# Patient Record
Sex: Female | Born: 1990 | Race: White | Hispanic: No | Marital: Married | Smoking: Former smoker
Health system: Southern US, Community
[De-identification: ages and names within clinical notes are randomized; demographics above are authoritative.]

## PROBLEM LIST (undated history)

## (undated) DIAGNOSIS — Z789 Other specified health status: Secondary | ICD-10-CM

## (undated) DIAGNOSIS — Z8742 Personal history of other diseases of the female genital tract: Secondary | ICD-10-CM

## (undated) HISTORY — PX: WISDOM TOOTH EXTRACTION: SHX21

## (undated) HISTORY — DX: Personal history of other diseases of the female genital tract: Z87.42

---

## 2016-09-10 DIAGNOSIS — Z8742 Personal history of other diseases of the female genital tract: Secondary | ICD-10-CM

## 2016-09-10 HISTORY — DX: Personal history of other diseases of the female genital tract: Z87.42

## 2016-11-10 HISTORY — PX: COLPOSCOPY: SHX161

## 2017-03-22 ENCOUNTER — Ambulatory Visit (INDEPENDENT_AMBULATORY_CARE_PROVIDER_SITE_OTHER): Payer: 59 | Admitting: Certified Nurse Midwife

## 2017-03-22 ENCOUNTER — Encounter: Payer: Self-pay | Admitting: Certified Nurse Midwife

## 2017-03-22 VITALS — BP 100/60 | Ht 66.0 in | Wt 159.0 lb

## 2017-03-22 DIAGNOSIS — Z3491 Encounter for supervision of normal pregnancy, unspecified, first trimester: Secondary | ICD-10-CM

## 2017-03-22 DIAGNOSIS — Z87898 Personal history of other specified conditions: Secondary | ICD-10-CM

## 2017-03-22 DIAGNOSIS — Z3A08 8 weeks gestation of pregnancy: Secondary | ICD-10-CM

## 2017-03-22 DIAGNOSIS — Z8742 Personal history of other diseases of the female genital tract: Secondary | ICD-10-CM

## 2017-03-22 DIAGNOSIS — Z1379 Encounter for other screening for genetic and chromosomal anomalies: Secondary | ICD-10-CM

## 2017-03-22 NOTE — Progress Notes (Signed)
New Obstetric Patient H&P    Chief Complaint: "Desires prenatal care." Pt c/o nausea, fatigue and lower abdominal cramping.   History of Present Illness: Patient is a 26 y.o. G1P0 Not Hispanic or Latino female, LMP 30 April 18 presents with amenorrhea and positive home pregnancy test. Based on her  LMP, her EDD is Estimated Date of Delivery: 10/29/17 and her EGA is 5269w3d. Since stopping her pills in August 2017, cycles are 2-4 days long, regular, and occur approximately every : 28 days. 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 Colposcopy and biopsy returned: + HPV changes   She had a urine pregnancy test which was positive 1 month(s)  Ago (30 April). Her last menstrual period was light and lasted for  1 day(s). Since her LMP she claims she has experienced nausea, fatigue and lower abdominal cramping.. She denies vaginal bleeding or vomiting. Her past medical history is remarkable for an allergy to Nitrile gloves and an abnormal Pap smear. This is her first pregnancy.  Since her LMP, she admits to the use of tobacco products  She is a former smoker. Quit smoking the end of March. Was smoking 1 pack/week (2-3 /day). She claims she has gained   2-3# pounds since the start of her pregnancy.  There are cats in the home in the home  no  She admits close contact with children on a regular basis  no  She has had chicken pox in the past yes She has had Tuberculosis exposures, symptoms, or previously tested positive for TB   no Current or past history of domestic violence. no  Genetic Screening/Teratology Counseling: (Includes patient, baby's father, or anyone in either family with:)   1. [facility]= [facility]  no 2. Thalassemia ([place], [place], Mediterranean, or Asian background): MCV<80  no 3. Neural tube defect (meningomyelocele, spina bifida, anencephaly)  no 4. Congenital heart defect  no   5. Down syndrome  no 6. Tay-Sachs (Jewish, [place])  no 7. Canavan's Disease  no 8. Sickle cell disease or trait (African)  no  9. Hemophilia or other blood disorders  no  10. Muscular dystrophy  no  11. Cystic fibrosis  no  12. Huntington's Chorea  no  13. Mental retardation/autism  no 14. Other inherited genetic or chromosomal disorder  no 15. Maternal metabolic disorder (DM, PKU, etc)  no 16. Patient or FOB with a child with a birth defect not listed above no  16a. Patient or FOB with a birth defect themselves no 17. Recurrent pregnancy loss, or stillbirth  no  18. Any medications since LMP other than prenatal vitamins (include vitamins, supplements, OTC meds, drugs, alcohol)  no 19. Any other genetic/environmental exposure to discuss  no  Infection History:   1. Lives with someone with TB or TB exposed  no  2. Patient or partner has history of genital herpes  no 3. Rash or viral illness since LMP  no 4. History of STI (GC, CT, HPV, syphilis, HIV)  no 5. History of recent travel :  no  Other pertinent information:  Kandee KeenCory is husband, age 26     Review of Systems:10 point review of systems negative unless otherwise noted in HPI  Past Medical History:  Past Medical History:  Diagnosis Date  . History of abnormal cervical Pap smear 09/10/2016   LGSIL/ biopsy +HPV changes    Past Surgical History:  Past Surgical History:  Procedure Laterality Date  . COLPOSCOPY  11/10/2016   [facility] LSIL  . WISDOM TOOTH EXTRACTION         Family History:  Family History  Problem Relation Age of Onset  . Diabetes Father   . Heart disease Maternal Grandmother   . Hypertension Maternal Grandmother   . Stroke Maternal Grandmother   . Heart disease Paternal Grandfather        Had heart attack    Social History:  Social History   Social History  . Marital status: Single    Spouse name: N/A  . Number of children: N/A  . Years of education: N/A   Occupational  History  . Not on file.   Social History Main Topics  . Smoking status: Former Smoker    Types: Cigarettes  . Smokeless tobacco: Never Used     Comment: 1 pack/week of cigarettes. Quit March 2018  . Alcohol use No     Comment: former  . Drug use: No  . Sexual activity: Yes    Partners: Male    Birth control/ protection: None   Other Topics Concern  . Not on file   Social History Narrative  . No narrative on file    Allergies:  Allergies  Allergen Reactions  . Other Rash    Nitrile gloves    Medications: Prior to Admission medications   Not on File    Physical Exam Vitals: BP 100/60   Ht 5\' 6"  (1.676 m)   Wt 159 lb (72.1 kg)   LMP 01/22/2017 (Exact Date)   BMI 25.66 kg/m   General: WF in  NAD HEENT: normocephalic, anicteric  Mouth: good dentition  Oropharynx: no inflammation or exudates Thyroid: no enlargement, no palpable nodules Pulmonary: No increased work of breathing, CTAB Cardiovascular: RRR,without murmur Abdomen: soft, non-tender, non-distended.  Umbilicus without lesions.  No hepatomegaly, splenomegaly or masses palpable. No evidence of hernia  Genitourinary:  External: Normal external female genitalia.  Normal urethral meatus, normal Bartholin's and Skene's glands.    Vagina: Normal vaginal mucosa, no evidence of prolapse.    Cervix: posterior, closed, no bleeding  Uterus:8 week size, AF, mobile, normal contour.    Adnexa: ovaries non-enlarged, no adnexal masses  Rectal: deferred Extremities: no edema, erythema, or tenderness Neurologic: Grossly intact Psychiatric: mood appropriate, affect full   Assessment: 26 y.o. G1P0 at [redacted]w[redacted]d presenting to initiate prenatal care Size=dates  Plan: 1) Avoid alcoholic beverages. 2) Patient encouraged not to smoke.  3) Discontinue the use of all non-medicinal drugs and chemicals.  4) Take prenatal vitamins daily.  5) Nutrition, food safety (fish, cheese advisories, and high nitrite foods) and exercise  discussed. 6) Obstetrical providers/ prenatal care discussed 7) Genetic Screening, such as with 1st Trimester Screening, MS AFP testing, carrier screening, and Ultrasound, is discussed with patient. At the conclusion of today's visit patient requested the following genetic tests: first trimester screen, CF, SMA, and Fragile X. Will have patient return in 3-4 weeks for these tests and follow up. Routine NOB labs done including urine culture, UDS and GC/ CHlamydia. Next Pap due postpartum. 8) Patient is asked about travel to areas at risk for the Zika virus, and counseled to avoid travel and exposure to mosquitoes or sexual partners who may have themselves been exposed to the virus. Testing is discussed, and will be ordered as appropriate.   Farrel Conners, CNM

## 2017-03-23 ENCOUNTER — Encounter: Payer: Self-pay | Admitting: Certified Nurse Midwife

## 2017-03-23 DIAGNOSIS — Z3490 Encounter for supervision of normal pregnancy, unspecified, unspecified trimester: Secondary | ICD-10-CM | POA: Insufficient documentation

## 2017-03-23 LAB — RPR+RH+ABO+RUB AB+AB SCR+CB...
Antibody Screen: NEGATIVE
HIV Screen 4th Generation wRfx: NONREACTIVE
Hematocrit: 37.8 % (ref 34.0–46.6)
Hemoglobin: 12.3 g/dL (ref 11.1–15.9)
Hepatitis B Surface Ag: NEGATIVE
MCH: 29.4 pg (ref 26.6–33.0)
MCHC: 32.5 g/dL (ref 31.5–35.7)
MCV: 90 fL (ref 79–97)
Platelets: 216 10*3/uL (ref 150–379)
RBC: 4.18 x10E6/uL (ref 3.77–5.28)
RDW: 12 % — ABNORMAL LOW (ref 12.3–15.4)
RPR Ser Ql: NONREACTIVE
Rh Factor: POSITIVE
Rubella Antibodies, IGG: 1.42 index (ref >0.99)
Varicella zoster IgG: 1954 index (ref >165)
WBC: 9.6 10*3/uL (ref 3.4–10.8)

## 2017-03-24 LAB — URINE DRUG PANEL 7
Amphetamines, Urine: NEGATIVE ng/mL
Barbiturate Quant, Ur: NEGATIVE ng/mL
Benzodiazepine Quant, Ur: NEGATIVE ng/mL
Cannabinoid Quant, Ur: NEGATIVE ng/mL
Cocaine (Metab.): NEGATIVE ng/mL
Opiate Quant, Ur: NEGATIVE ng/mL
PCP Quant, Ur: NEGATIVE ng/mL

## 2017-03-25 LAB — URINE CULTURE

## 2017-03-25 LAB — GC/CHLAMYDIA PROBE AMP
Chlamydia trachomatis, NAA: NEGATIVE
Neisseria gonorrhoeae by PCR: NEGATIVE

## 2017-04-19 ENCOUNTER — Other Ambulatory Visit (INDEPENDENT_AMBULATORY_CARE_PROVIDER_SITE_OTHER): Payer: 59

## 2017-04-19 ENCOUNTER — Ambulatory Visit (INDEPENDENT_AMBULATORY_CARE_PROVIDER_SITE_OTHER): Payer: 59 | Admitting: Advanced Practice Midwife

## 2017-04-19 VITALS — BP 114/70 | Wt 160.0 lb

## 2017-04-19 DIAGNOSIS — Z3491 Encounter for supervision of normal pregnancy, unspecified, first trimester: Secondary | ICD-10-CM

## 2017-04-19 DIAGNOSIS — Z3A12 12 weeks gestation of pregnancy: Secondary | ICD-10-CM

## 2017-04-19 DIAGNOSIS — Z1379 Encounter for other screening for genetic and chromosomal anomalies: Secondary | ICD-10-CM

## 2017-04-19 NOTE — Progress Notes (Signed)
nt screen today.

## 2017-04-19 NOTE — Progress Notes (Signed)
Having some itching on arms and legs. Has recently changed laundry detergent. Recommend using gentle/natural skin care products, avoiding triggers. Discussion of possibilities- food sensitivity, hormonal changes, skin care products. Nausea has improved. 1st trimester screen today. RTC in 4 weeks ROB

## 2017-04-24 LAB — FIRST TRIMESTER SCREEN W/NT
CRL: 65.9 mm
DIA MoM: 0.69
DIA Value: 156.2 pg/mL
Gest Age-Collect: 12.7 weeks
Maternal Age At EDD: 28.9 yr
Nuchal Translucency MoM: 0.73
Nuchal Translucency: 1.2 mm
Number of Fetuses: 1
PAPP-A MoM: 1.39
PAPP-A Value: 1298.5 ng/mL
Test Results:: NEGATIVE
Weight: 160 [lb_av]
hCG MoM: 1.37
hCG Value: 112.3 IU/mL

## 2017-05-18 ENCOUNTER — Ambulatory Visit (INDEPENDENT_AMBULATORY_CARE_PROVIDER_SITE_OTHER): Payer: 59 | Admitting: Certified Nurse Midwife

## 2017-05-18 VITALS — BP 104/64 | Wt 164.0 lb

## 2017-05-18 DIAGNOSIS — Z3A16 16 weeks gestation of pregnancy: Secondary | ICD-10-CM

## 2017-05-18 DIAGNOSIS — Z3490 Encounter for supervision of normal pregnancy, unspecified, unspecified trimester: Secondary | ICD-10-CM

## 2017-05-18 NOTE — Progress Notes (Signed)
Patient now 16wk4 days. Had a negative first trimester screen. Declines further genetic testing. FH at 1/2 between U and SP. FHTs WNL Anatomy scan in 3-4 weeks

## 2017-05-18 NOTE — Progress Notes (Signed)
Pt reports no problems.   

## 2017-06-15 ENCOUNTER — Ambulatory Visit (INDEPENDENT_AMBULATORY_CARE_PROVIDER_SITE_OTHER): Payer: 59

## 2017-06-15 ENCOUNTER — Ambulatory Visit (INDEPENDENT_AMBULATORY_CARE_PROVIDER_SITE_OTHER): Payer: 59 | Admitting: Certified Nurse Midwife

## 2017-06-15 VITALS — BP 110/70 | Wt 170.0 lb

## 2017-06-15 DIAGNOSIS — Z3490 Encounter for supervision of normal pregnancy, unspecified, unspecified trimester: Secondary | ICD-10-CM

## 2017-06-15 DIAGNOSIS — O26812 Pregnancy related exhaustion and fatigue, second trimester: Secondary | ICD-10-CM

## 2017-06-15 DIAGNOSIS — Z362 Encounter for other antenatal screening follow-up: Secondary | ICD-10-CM | POA: Diagnosis not present

## 2017-06-15 DIAGNOSIS — Z3A20 20 weeks gestation of pregnancy: Secondary | ICD-10-CM

## 2017-06-15 DIAGNOSIS — Z3492 Encounter for supervision of normal pregnancy, unspecified, second trimester: Secondary | ICD-10-CM

## 2017-06-15 NOTE — Progress Notes (Signed)
Pt c/o migraine headaches, acid reflux, lethargy and difficulty sleeping. Anatomy scan today. Secret for now!

## 2017-06-16 LAB — TSH: TSH: 2.58 u[IU]/mL (ref 0.450–4.500)

## 2017-06-18 DIAGNOSIS — G43909 Migraine, unspecified, not intractable, without status migrainosus: Secondary | ICD-10-CM | POA: Insufficient documentation

## 2017-06-18 NOTE — Progress Notes (Signed)
G1 P0 at 20wk 4d ROB/ anatomy scan today Anatomy scan: complete and normal. CGA 20wk5d, posterior placenta. Discussed migraine headaches unrelieved with Tylenol 1000 mgm/ may be increased due to disturbed sleep at night (no sleep apnea or snoring per partner) Discussed OTC treatment for acid reflux and to help sleep. Can take Excedrin tension headache for headache. TSH drawn for complaints of fatigue ROB in 4 weeks.

## 2017-07-13 ENCOUNTER — Telehealth: Payer: Self-pay

## 2017-07-13 ENCOUNTER — Ambulatory Visit (INDEPENDENT_AMBULATORY_CARE_PROVIDER_SITE_OTHER): Payer: 59 | Admitting: Certified Nurse Midwife

## 2017-07-13 ENCOUNTER — Encounter: Payer: 59 | Admitting: Certified Nurse Midwife

## 2017-07-13 VITALS — BP 102/64 | Wt 176.0 lb

## 2017-07-13 DIAGNOSIS — Z3492 Encounter for supervision of normal pregnancy, unspecified, second trimester: Secondary | ICD-10-CM

## 2017-07-13 DIAGNOSIS — K219 Gastro-esophageal reflux disease without esophagitis: Secondary | ICD-10-CM | POA: Insufficient documentation

## 2017-07-13 DIAGNOSIS — Z3A24 24 weeks gestation of pregnancy: Secondary | ICD-10-CM

## 2017-07-13 MED ORDER — PANTOPRAZOLE SODIUM 40 MG PO TBEC: 40.0000 mg | DELAYED_RELEASE_TABLET | Freq: Two times a day (BID) | ORAL | 1 refills | Status: DC

## 2017-07-13 NOTE — Telephone Encounter (Signed)
Contacted [facility] and updated rx.

## 2017-07-13 NOTE — Telephone Encounter (Signed)
Let's do once a day

## 2017-07-13 NOTE — Telephone Encounter (Signed)
Would you like to update rx or do you want me to pursue a prior authorization?

## 2017-07-13 NOTE — Progress Notes (Signed)
ROB Pt is having swelling in left ankle with some pain when standing for long periods of time.

## 2017-07-13 NOTE — Progress Notes (Signed)
ROB at 24wk4d: Has not had any further headaches since last visit. Pepcid helpful for only a few hours. Will switch to pantoprazole 40 mgm once or twice daily Having some ankle edema bilaterally. Discussed use of knee high compression hose at work when sitting most of the day. Baby active ROB and 28 week labs in 4 weeks. Marissa Herman, CNM

## 2017-07-13 NOTE — Telephone Encounter (Signed)
Pt seen by [facility] & rx'd Protonix TID. Insurance will only cover QD. Needs prior auth. [id number] [id number]

## 2017-08-13 ENCOUNTER — Other Ambulatory Visit: Payer: 59

## 2017-08-13 ENCOUNTER — Ambulatory Visit: Payer: 59 | Admitting: Certified Nurse Midwife

## 2017-08-13 VITALS — BP 110/70 | Wt 182.0 lb

## 2017-08-13 DIAGNOSIS — Z3493 Encounter for supervision of normal pregnancy, unspecified, third trimester: Secondary | ICD-10-CM

## 2017-08-13 DIAGNOSIS — Z3A29 29 weeks gestation of pregnancy: Secondary | ICD-10-CM

## 2017-08-13 NOTE — Progress Notes (Signed)
Pt reports no problems. 28 wk labs today.  

## 2017-08-13 NOTE — Progress Notes (Signed)
ROB at 29 weeks. Having 28 week labs today Protonix effective in relieving reflux. Does not need to take every day. 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 Plans to breast feed Given info on childbirth and breast feeding classes.  Also discussed CCBB/ donating cord blood Travel precautions given ROB in 2 weeks

## 2017-08-14 LAB — 28 WEEK RH+PANEL
Basophils Absolute: 0 10*3/uL (ref 0.0–0.2)
Basos: 0 %
EOS (ABSOLUTE): 0.1 10*3/uL (ref 0.0–0.4)
Eos: 1 %
Gestational Diabetes Screen: 100 mg/dL (ref 65–139)
HIV Screen 4th Generation wRfx: NONREACTIVE
Hematocrit: 36.7 % (ref 34.0–46.6)
Hemoglobin: 12.4 g/dL (ref 11.1–15.9)
Immature Grans (Abs): 0.1 10*3/uL (ref 0.0–0.1)
Immature Granulocytes: 1 %
Lymphocytes Absolute: 2.8 10*3/uL (ref 0.7–3.1)
Lymphs: 22 %
MCH: 31.1 pg (ref 26.6–33.0)
MCHC: 33.8 g/dL (ref 31.5–35.7)
MCV: 92 fL (ref 79–97)
Monocytes Absolute: 0.7 10*3/uL (ref 0.1–0.9)
Monocytes: 6 %
Neutrophils Absolute: 9.2 10*3/uL — ABNORMAL HIGH (ref 1.4–7.0)
Neutrophils: 70 %
Platelets: 206 10*3/uL (ref 150–379)
RBC: 3.99 x10E6/uL (ref 3.77–5.28)
RDW: 13.1 % (ref 12.3–15.4)
RPR Ser Ql: NONREACTIVE
WBC: 12.8 10*3/uL — ABNORMAL HIGH (ref 3.4–10.8)

## 2017-08-27 ENCOUNTER — Ambulatory Visit (INDEPENDENT_AMBULATORY_CARE_PROVIDER_SITE_OTHER): Payer: 59 | Admitting: Obstetrics & Gynecology

## 2017-08-27 VITALS — BP 120/80 | Wt 189.0 lb

## 2017-08-27 DIAGNOSIS — Z23 Encounter for immunization: Secondary | ICD-10-CM | POA: Diagnosis not present

## 2017-08-27 DIAGNOSIS — Z3493 Encounter for supervision of normal pregnancy, unspecified, third trimester: Secondary | ICD-10-CM

## 2017-08-27 DIAGNOSIS — Z3A31 31 weeks gestation of pregnancy: Secondary | ICD-10-CM

## 2017-08-27 MED ORDER — TETANUS-DIPHTH-ACELL PERTUSSIS 5-2.5-18.5 LF-MCG/0.5 IM SUSP
0.5000 mL | Freq: Once | INTRAMUSCULAR | Status: AC
  Administered 2017-08-27: 0.5 mL via INTRAMUSCULAR

## 2017-08-27 NOTE — Progress Notes (Signed)
  Subjective  Fetal Movement? yes Contractions? no Leaking Fluid? no Vaginal Bleeding? no  Objective  BP 120/80   Wt 189 lb (85.7 kg)   LMP 01/22/2017 (Exact Date)   BMI 30.51 kg/m  General: NAD Pumonary: no increased work of breathing Abdomen: gravid, non-tender Extremities: no edema Psychiatric: mood appropriate, affect full  Assessment  26 y.o. G1P0 at 5454w0d by  10/29/2017, by Last Menstrual Period presenting for routine prenatal visit  Plan   Problem List Items Addressed This Visit      Other   Supervision of low-risk pregnancy    Other Visit Diagnoses    [redacted] weeks gestation of pregnancy    -  Primary    TDaP today Unsure BC  Annamarie MajorPaul Presly Steinruck, MD, Merlinda FrederickFACOG [facility], [facility] 08/27/2017  4:38 PM

## 2017-08-27 NOTE — Patient Instructions (Signed)
Third Trimester of Pregnancy The third trimester is from week 28 through week 40 (months 7 through 9). The third trimester is a time when the unborn baby (fetus) is growing rapidly. At the end of the ninth month, the fetus is about 20 inches in length and weighs 6-10 pounds. Body changes during your third trimester Your body will continue to go through many changes during pregnancy. The changes vary from woman to woman. During the third trimester:  Your weight will continue to increase. You can expect to gain 25-35 pounds (11-16 kg) by the end of the pregnancy.  You may begin to get stretch marks on your hips, abdomen, and breasts.  You may urinate more often because the fetus is moving lower into your pelvis and pressing on your bladder.  You may develop or continue to have heartburn. This is caused by increased hormones that slow down muscles in the digestive tract.  You may develop or continue to have constipation because increased hormones slow digestion and cause the muscles that push waste through your intestines to relax.  You may develop hemorrhoids. These are swollen veins (varicose veins) in the rectum that can itch or be painful.  You may develop swollen, bulging veins (varicose veins) in your legs.  You may have increased body aches in the pelvis, back, or thighs. This is due to weight gain and increased hormones that are relaxing your joints.  You may have changes in your hair. These can include thickening of your hair, rapid growth, and changes in texture. Some women also have hair loss during or after pregnancy, or hair that feels dry or thin. Your hair will most likely return to normal after your baby is born.  Your breasts will continue to grow and they will continue to become tender. A yellow fluid (colostrum) may leak from your breasts. This is the first milk you are producing for your baby.  Your belly button may stick out.  You may notice more swelling in your hands,  face, or ankles.  You may have increased tingling or numbness in your hands, arms, and legs. The skin on your belly may also feel numb.  You may feel short of breath because of your expanding uterus.  You may have more problems sleeping. This can be caused by the size of your belly, increased need to urinate, and an increase in your body's metabolism.  You may notice the fetus "dropping," or moving lower in your abdomen (lightening).  You may have increased vaginal discharge.  You may notice your joints feel loose and you may have pain around your pelvic bone.  What to expect at prenatal visits You will have prenatal exams every 2 weeks until week 36. Then you will have weekly prenatal exams. During a routine prenatal visit:  You will be weighed to make sure you and the baby are growing normally.  Your blood pressure will be taken.  Your abdomen will be measured to track your baby's growth.  The fetal heartbeat will be listened to.  Any test results from the previous visit will be discussed.  You may have a cervical check near your due date to see if your cervix has softened or thinned (effaced).  You will be tested for Group B streptococcus. This happens between 35 and 37 weeks.  Your health care provider may ask you:  What your birth plan is.  How you are feeling.  If you are feeling the baby move.  If you have had   any abnormal symptoms, such as leaking fluid, bleeding, severe headaches, or abdominal cramping.  If you are using any tobacco products, including cigarettes, chewing tobacco, and electronic cigarettes.  If you have any questions.  Other tests or screenings that may be performed during your third trimester include:  Blood tests that check for low iron levels (anemia).  Fetal testing to check the health, activity level, and growth of the fetus. Testing is done if you have certain medical conditions or if there are problems during the  pregnancy.  Nonstress test (NST). This test checks the health of your baby to make sure there are no signs of problems, such as the baby not getting enough oxygen. During this test, a belt is placed around your belly. The baby is made to move, and its heart rate is monitored during movement.  What is false labor? False labor is a condition in which you feel small, irregular tightenings of the muscles in the womb (contractions) that usually go away with rest, changing position, or drinking water. These are called Braxton Hicks contractions. Contractions may last for hours, days, or even weeks before true labor sets in. If contractions come at regular intervals, become more frequent, increase in intensity, or become painful, you should see your health care provider. What are the signs of labor?  Abdominal cramps.  Regular contractions that start at 10 minutes apart and become stronger and more frequent with time.  Contractions that start on the top of the uterus and spread down to the lower abdomen and back.  Increased pelvic pressure and dull back pain.  A watery or bloody mucus discharge that comes from the vagina.  Leaking of amniotic fluid. This is also known as your "water breaking." It could be a slow trickle or a gush. Let your health care provider know if it has a color or strange odor. If you have any of these signs, call your health care provider right away, even if it is before your due date. Follow these instructions at home: Medicines  Follow your health care provider's instructions regarding medicine use. Specific medicines may be either safe or unsafe to take during pregnancy.  Take a prenatal vitamin that contains at least 600 micrograms (mcg) of folic acid.  If you develop constipation, try taking a stool softener if your health care provider approves. Eating and drinking  Eat a balanced diet that includes fresh fruits and vegetables, whole grains, good sources of protein  such as meat, eggs, or tofu, and low-fat dairy. Your health care provider will help you determine the amount of weight gain that is right for you.  Avoid raw meat and uncooked cheese. These carry germs that can cause birth defects in the baby.  If you have low calcium intake from food, talk to your health care provider about whether you should take a daily calcium supplement.  Eat four or five small meals rather than three large meals a day.  Limit foods that are high in fat and processed sugars, such as fried and sweet foods.  To prevent constipation: ? Drink enough fluid to keep your urine clear or pale yellow. ? Eat foods that are high in fiber, such as fresh fruits and vegetables, whole grains, and beans. Activity  Exercise only as directed by your health care provider. Most women can continue their usual exercise routine during pregnancy. Try to exercise for 30 minutes at least 5 days a week. Stop exercising if you experience uterine contractions.  Avoid heavy   lifting.  Do not exercise in extreme heat or humidity, or at high altitudes.  Wear low-heel, comfortable shoes.  Practice good posture.  You may continue to have sex unless your health care provider tells you otherwise. Relieving pain and discomfort  Take frequent breaks and rest with your legs elevated if you have leg cramps or low back pain.  Take warm sitz baths to soothe any pain or discomfort caused by hemorrhoids. Use hemorrhoid cream if your health care provider approves.  Wear a good support bra to prevent discomfort from breast tenderness.  If you develop varicose veins: ? Wear support pantyhose or compression stockings as told by your healthcare provider. ? Elevate your feet for 15 minutes, 3-4 times a day. Prenatal care  Write down your questions. Take them to your prenatal visits.  Keep all your prenatal visits as told by your health care provider. This is important. Safety  Wear your seat belt at  all times when driving.  Make a list of emergency phone numbers, including numbers for family, friends, the hospital, and police and fire departments. General instructions  Avoid cat litter boxes and soil used by cats. These carry germs that can cause birth defects in the baby. If you have a cat, ask someone to clean the litter box for you.  Do not travel far distances unless it is absolutely necessary and only with the approval of your health care provider.  Do not use hot tubs, steam rooms, or saunas.  Do not drink alcohol.  Do not use any products that contain nicotine or tobacco, such as cigarettes and e-cigarettes. If you need help quitting, ask your health care provider.  Do not use any medicinal herbs or unprescribed drugs. These chemicals affect the formation and growth of the baby.  Do not douche or use tampons or scented sanitary pads.  Do not cross your legs for long periods of time.  To prepare for the arrival of your baby: ? Take prenatal classes to understand, practice, and ask questions about labor and delivery. ? Make a trial run to the hospital. ? Visit the hospital and tour the maternity area. ? Arrange for maternity or paternity leave through employers. ? Arrange for family and friends to take care of pets while you are in the hospital. ? Purchase a rear-facing car seat and make sure you know how to install it in your car. ? Pack your hospital bag. ? Prepare the baby's nursery. Make sure to remove all pillows and stuffed animals from the baby's crib to prevent suffocation.  Visit your dentist if you have not gone during your pregnancy. Use a soft toothbrush to brush your teeth and be gentle when you floss. Contact a health care provider if:  You are unsure if you are in labor or if your water has broken.  You become dizzy.  You have mild pelvic cramps, pelvic pressure, or nagging pain in your abdominal area.  You have lower back pain.  You have persistent  nausea, vomiting, or diarrhea.  You have an unusual or bad smelling vaginal discharge.  You have pain when you urinate. Get help right away if:  Your water breaks before 37 weeks.  You have regular contractions less than 5 minutes apart before 37 weeks.  You have a fever.  You are leaking fluid from your vagina.  You have spotting or bleeding from your vagina.  You have severe abdominal pain or cramping.  You have rapid weight loss or weight gain.    You have shortness of breath with chest pain.  You notice sudden or extreme swelling of your face, hands, ankles, feet, or legs.  Your baby makes fewer than 10 movements in 2 hours.  You have severe headaches that do not go away when you take medicine.  You have vision changes. Summary  The third trimester is from week 28 through week 40, months 7 through 9. The third trimester is a time when the unborn baby (fetus) is growing rapidly.  During the third trimester, your discomfort may increase as you and your baby continue to gain weight. You may have abdominal, leg, and back pain, sleeping problems, and an increased need to urinate.  During the third trimester your breasts will keep growing and they will continue to become tender. A yellow fluid (colostrum) may leak from your breasts. This is the first milk you are producing for your baby.  False labor is a condition in which you feel small, irregular tightenings of the muscles in the womb (contractions) that eventually go away. These are called Braxton Hicks contractions. Contractions may last for hours, days, or even weeks before true labor sets in.  Signs of labor can include: abdominal cramps; regular contractions that start at 10 minutes apart and become stronger and more frequent with time; watery or bloody mucus discharge that comes from the vagina; increased pelvic pressure and dull back pain; and leaking of amniotic fluid. This information is not intended to replace advice  given to you by your health care provider. Make sure you discuss any questions you have with your health care provider. Document Released: 09/05/2001 Document Revised: 02/17/2016 Document Reviewed: 11/12/2012 Elsevier Interactive Patient Education  2017 [employer]  

## 2017-08-27 NOTE — Addendum Note (Signed)
Addended by: Cornelius MorasPATTERSON, Meagan Ancona D on: 08/27/2017 05:06 PM   Modules accepted: Orders

## 2017-09-11 ENCOUNTER — Encounter: Payer: Self-pay | Admitting: Obstetrics and Gynecology

## 2017-09-11 ENCOUNTER — Ambulatory Visit (INDEPENDENT_AMBULATORY_CARE_PROVIDER_SITE_OTHER): Payer: 59 | Admitting: Obstetrics and Gynecology

## 2017-09-11 VITALS — BP 118/78 | Wt 194.0 lb

## 2017-09-11 DIAGNOSIS — Z3A33 33 weeks gestation of pregnancy: Secondary | ICD-10-CM

## 2017-09-11 DIAGNOSIS — Z3493 Encounter for supervision of normal pregnancy, unspecified, third trimester: Secondary | ICD-10-CM

## 2017-09-11 NOTE — Progress Notes (Signed)
Routine Prenatal Care Visit  Subjective  Marissa MarrowBrittany Herman is a 26 y.o. G1P0 at 41101w1d being seen today for ongoing prenatal care.  She is currently monitored for the following issues for this low-risk pregnancy and has History of abnormal cervical Pap smear; Supervision of low-risk pregnancy; Migraine headache; and Gastroesophageal reflux disease on their problem list.  ----------------------------------------------------------------------------------- Patient reports no complaints.   Contractions: Not present. Vag. Bleeding: None.  Movement: Present. Denies leaking of fluid.  ----------------------------------------------------------------------------------- The following portions of the patient's history were reviewed and updated as appropriate: allergies, current medications, past family history, past medical history, past social history, past surgical history and problem list. Problem list updated.   Objective  Blood pressure 118/78, weight 194 lb (88 kg), last menstrual period 01/22/2017. Pregravid weight 159 lb (72.1 kg) Total Weight Gain 35 lb (15.9 kg) Urinalysis: Urine Protein: Negative Urine Glucose: Negative  Fetal Status: Fetal Heart Rate (bpm): 145 Fundal Height: 33 cm Movement: Present     General:  Alert, oriented and cooperative. Patient is in no acute distress.  Skin: Skin is warm and dry. No rash noted.   Cardiovascular: Normal heart rate noted  Respiratory: Normal respiratory effort, no problems with respiration noted  Abdomen: Soft, gravid, appropriate for gestational age. Pain/Pressure: Absent     Pelvic:  Cervical exam deferred        Extremities: Normal range of motion.     Mental Status: Normal mood and affect. Normal behavior. Normal judgment and thought content.   Assessment   26 y.o. G1P0 at 25101w1d by  10/29/2017, by Last Menstrual Period presenting for routine prenatal visit  Plan   first pregnancy Problems (from 03/22/17 to present)    Problem Noted  Resolved   Supervision of low-risk pregnancy 03/23/2017 by Farrel ConnersGutierrez, Colleen, CNM No   Overview Addendum 07/13/2017  2:24 PM by Farrel ConnersGutierrez, Colleen, CNM     Clinic [facility] Prenatal Labs  Dating LMP=12wk3d ultrasound Blood type: O/Positive/-- (06/28 1553)   Genetic Screen 1 Screen:    AFP:     Quad:     NIPS: Antibody:Negative (06/28 1553)  Anatomic US normal Rubella: 1.42 (06/28 1553)  GTT Early:               Third trimester:  RPR: Non Reactive (06/28 1553)   Flu vaccine  HBsAg: Negative (06/28 1553)   TDaP vaccine                                               Rhogam: HIV: Non Reactive (06/28 1553)   Baby Food                                               GBS: (For PCN allergy, check sensitivities)  Contraception  Pap: LGSIL 12/17, colpo: +HPV changes  Circumcision  Varicella: Immune  Pediatrician  GC/ Chlamydia neg/neg  Support Person  UDS negative   Repeat Pap postpartum       Preterm labor symptoms and general obstetric precautions including but not limited to vaginal bleeding, contractions, leaking of fluid and fetal movement were reviewed in detail with the patient. Please refer to After Visit Summary for other counseling recommendations.   Return in about 2 weeks (around 09/25/2017) for Routine  Prenatal Appointment.  Thomasene MohairStephen Jackson, MD  09/11/2017 4:26 PM

## 2017-09-14 ENCOUNTER — Telehealth: Payer: Self-pay

## 2017-09-14 NOTE — Telephone Encounter (Signed)
FMLA/DISABILITY form for [employer] filled out, signature obtained, and given to [place] for processing. 

## 2017-09-25 NOTE — L&D Delivery Note (Signed)
Delivery Note At 8:56 AM a live  viable female infnat was delivered through an intact perineum. LOA presentation. Anterior shouklder delivered with gentle downward guidance. Delivery of posteriro shoulder with gentle upward guidance. Infnat placed on maternal abdomen. Infant stimulated and dried. Vigorous with good cry. Cord clamped and cut after 1 minute. Placenta delivered spontaneous and intact with 3vc. Brisk bleeding after delivery of placenta. IV pitocin and IM methergine given. Bleeding improved. Repair of second degree perineal laceration and periurethral using 2-0 and 3-0 vicryl with local anesthesia. Mom and baby recovering in stable condition. Uterus firm and below the umbilicus. Counts correct x2.  Lacerations: 2nd degree and periurethral EBL: 500cc Mom to postpartum.  Baby to Couplet care / Skin to Skin. Natale Milchhristanna R Syrianna Schillaci MD 11/01/2017, 9:54 AM

## 2017-10-01 ENCOUNTER — Other Ambulatory Visit: Payer: Self-pay | Admitting: Obstetrics and Gynecology

## 2017-10-01 ENCOUNTER — Encounter: Payer: Self-pay | Admitting: Obstetrics and Gynecology

## 2017-10-01 ENCOUNTER — Ambulatory Visit (INDEPENDENT_AMBULATORY_CARE_PROVIDER_SITE_OTHER): Payer: BLUE CROSS/BLUE SHIELD | Admitting: Obstetrics and Gynecology

## 2017-10-01 VITALS — BP 122/70 | Wt 196.0 lb

## 2017-10-01 DIAGNOSIS — Z3493 Encounter for supervision of normal pregnancy, unspecified, third trimester: Secondary | ICD-10-CM

## 2017-10-01 DIAGNOSIS — Z3A36 36 weeks gestation of pregnancy: Secondary | ICD-10-CM

## 2017-10-01 NOTE — Progress Notes (Signed)
Routine Prenatal Care Visit  Subjective  Marissa Herman is a 27 y.o. G1P0 at 74Ramond Marrow74w0d being seen today for ongoing prenatal care.  She is currently monitored for the following issues for this low-risk pregnancy and has History of abnormal cervical Pap smear; Supervision of low-risk pregnancy; Migraine headache; and Gastroesophageal reflux disease on their problem list.  ----------------------------------------------------------------------------------- Patient reports no complaints.   Contractions: Irritability. Vag. Bleeding: None.  Movement: Present. Denies leaking of fluid.  ----------------------------------------------------------------------------------- The following portions of the patient's history were reviewed and updated as appropriate: allergies, current medications, past family history, past medical history, past social history, past surgical history and problem list. Problem list updated.   Objective  Blood pressure 122/70, weight 196 lb (88.9 kg), last menstrual period 01/22/2017. Pregravid weight 159 lb (72.1 kg) Total Weight Gain 37 lb (16.8 kg) Urinalysis: Urine Protein: Negative Urine Glucose: Negative  Fetal Status: Fetal Heart Rate (bpm): 140 Fundal Height: 36 cm Movement: Present     General:  Alert, oriented and cooperative. Patient is in no acute distress.  Skin: Skin is warm and dry. No rash noted.   Cardiovascular: Normal heart rate noted  Respiratory: Normal respiratory effort, no problems with respiration noted  Abdomen: Soft, gravid, appropriate for gestational age. Pain/Pressure: Absent     Pelvic:  Cervical exam deferred        Extremities: Normal range of motion.     Mental Status: Normal mood and affect. Normal behavior. Normal judgment and thought content.   Assessment   27 y.o. G1P0 at 9074w0d by  10/29/2017, by Last Menstrual Period presenting for routine prenatal visit  Plan   first pregnancy Problems (from 03/22/17 to present)    Problem Noted  Resolved   Supervision of low-risk pregnancy 03/23/2017 by Farrel ConnersGutierrez, Colleen, CNM No   Overview Addendum 07/13/2017  2:24 PM by Farrel ConnersGutierrez, Colleen, CNM     [facility]inic [facility] Prenatal Labs  Dating LMP=12wk3d ultrasound Blood type: O/Positive/-- (06/28 1553)   Genetic Screen 1 Screen:    AFP:     Quad:     NIPS: Antibody:Negative (06/28 1553)  Anatomic US normal Rubella: 1.42 (06/28 1553)  GTT Early:               Third trimester:  RPR: Non Reactive (06/28 1553)   Flu vaccine  HBsAg: Negative (06/28 1553)   TDaP vaccine                                               Rhogam: HIV: Non Reactive (06/28 1553)   Baby Food                                               GBS: (For PCN allergy, check sensitivities)  Contraception  Pap: LGSIL 12/17, colpo: +HPV changes  Circumcision  Varicella: Immune  Pediatrician  GC/ Chlamydia neg/neg  Support Person  UDS negative   Repeat Pap postpartum        Preterm labor symptoms and general obstetric precautions including but not limited to vaginal bleeding, contractions, leaking of fluid and fetal movement were reviewed in detail with the patient. Please refer to After Visit Summary for other counseling recommendations.   Return in about 1 week (around 10/08/2017) for Routine  Prenatal Appointment.  Thomasene Mohair, MD  10/01/2017 4:49 PM

## 2017-10-01 NOTE — Patient Instructions (Signed)

## 2017-10-03 LAB — STREP GP B NAA: Strep Gp B NAA: POSITIVE — AB

## 2017-10-03 LAB — GC/CHLAMYDIA PROBE AMP
Chlamydia trachomatis, NAA: NEGATIVE
Neisseria gonorrhoeae by PCR: NEGATIVE

## 2017-10-05 ENCOUNTER — Telehealth: Payer: Self-pay

## 2017-10-05 NOTE — Telephone Encounter (Signed)
FMLA/DISABILITY form (2nd) filled out for [employer], signature obtained, and given to [place] for processing.

## 2017-10-08 ENCOUNTER — Ambulatory Visit (INDEPENDENT_AMBULATORY_CARE_PROVIDER_SITE_OTHER): Payer: BLUE CROSS/BLUE SHIELD | Admitting: Obstetrics and Gynecology

## 2017-10-08 ENCOUNTER — Encounter: Payer: Self-pay | Admitting: Obstetrics and Gynecology

## 2017-10-08 VITALS — BP 120/74 | Wt 200.0 lb

## 2017-10-08 DIAGNOSIS — Z3A37 37 weeks gestation of pregnancy: Secondary | ICD-10-CM

## 2017-10-08 DIAGNOSIS — Z3493 Encounter for supervision of normal pregnancy, unspecified, third trimester: Secondary | ICD-10-CM

## 2017-10-08 NOTE — Progress Notes (Signed)
Routine Prenatal Care Visit  Subjective  Marissa Herman is a 27 y.o. G1P0 at 4779w0d being seen today for ongoing prenatal care.  She is currently monitored for the following issues for this low-risk pregnancy and has History of abnormal cervical Pap smear; Supervision of low-risk pregnancy; Migraine headache; and Gastroesophageal reflux disease on their problem list.  ----------------------------------------------------------------------------------- Patient reports no complaints.   Contractions: Irritability. Vag. Bleeding: None.  Movement: Present. Denies leaking of fluid.  ----------------------------------------------------------------------------------- The following portions of the patient's history were reviewed and updated as appropriate: allergies, current medications, past family history, past medical history, past social history, past surgical history and problem list. Problem list updated.   Objective  Blood pressure 120/74, weight 200 lb (90.7 kg), last menstrual period 01/22/2017. Pregravid weight 159 lb (72.1 kg) Total Weight Gain 41 lb (18.6 kg) Urinalysis: Urine Protein: Negative Urine Glucose: Negative  Fetal Status: Fetal Heart Rate (bpm): 150 Fundal Height: 37 cm Movement: Present     General:  Alert, oriented and cooperative. Patient is in no acute distress.  Skin: Skin is warm and dry. No rash noted.   Cardiovascular: Normal heart rate noted  Respiratory: Normal respiratory effort, no problems with respiration noted  Abdomen: Soft, gravid, appropriate for gestational age. Pain/Pressure: Absent     Pelvic:  Cervical exam deferred        Extremities: Normal range of motion.     Mental Status: Normal mood and affect. Normal behavior. Normal judgment and thought content.   Assessment   27 y.o. G1P0 at 5879w0d by  10/29/2017, by Last Menstrual Period presenting for routine prenatal visit  Plan   first pregnancy Problems (from 03/22/17 to present)    Problem Noted  Resolved   Supervision of low-risk pregnancy 03/23/2017 by Farrel ConnersGutierrez, Colleen, CNM No   Overview Addendum 10/04/2017 12:05 PM by Conard NovakJackson, Stephen D, MD     [facility]inic [facility] Prenatal Labs  Dating LMP=12wk3d ultrasound Blood type: O/Positive/-- (06/28 1553)   Genetic Screen 1 Screen:    AFP:     Quad:     NIPS: Antibody:Negative (06/28 1553)  Anatomic US normal Rubella: 1.42 (06/28 1553)  GTT Early:               Third trimester:  RPR: Non Reactive (06/28 1553)   Flu vaccine  HBsAg: Negative (06/28 1553)   TDaP vaccine                                               Rhogam: HIV: Non Reactive (06/28 1553)   Baby Food                                               GBS:  Positive 1/7  Contraception  Pap: LGSIL 12/17, colpo: +HPV changes  Circumcision  Varicella: Immune  Pediatrician  GC/ Chlamydia neg/neg  Support Person  UDS negative   Repeat Pap postpartum          Term labor symptoms and general obstetric precautions including but not limited to vaginal bleeding, contractions, leaking of fluid and fetal movement were reviewed in detail with the patient. Please refer to After Visit Summary for other counseling recommendations.   Return in about 1 week (around 10/15/2017) for Routine  Prenatal Appointment.  Thomasene Mohair, MD  10/08/2017 4:55 PM

## 2017-10-08 NOTE — Patient Instructions (Signed)

## 2017-10-15 ENCOUNTER — Ambulatory Visit (INDEPENDENT_AMBULATORY_CARE_PROVIDER_SITE_OTHER): Payer: BLUE CROSS/BLUE SHIELD | Admitting: Obstetrics and Gynecology

## 2017-10-15 VITALS — BP 136/72 | Wt 202.0 lb

## 2017-10-15 DIAGNOSIS — Z3493 Encounter for supervision of normal pregnancy, unspecified, third trimester: Secondary | ICD-10-CM

## 2017-10-15 DIAGNOSIS — Z3A38 38 weeks gestation of pregnancy: Secondary | ICD-10-CM

## 2017-10-15 NOTE — Progress Notes (Signed)
Routine Prenatal Care Visit  Subjective  Marissa Herman is a 27 y.o. G1P0 at [redacted]w[redacted]d being seen today for ongoing prenatal care.  She is currently monitored for the following issues for this low-risk pregnancy and has History of abnormal cervical Pap smear; Supervision of low-risk pregnancy; Migraine headache; Gastroesophageal reflux disease; and Positive GBS test on their problem list.  ----------------------------------------------------------------------------------- Patient reports backache.   Contractions: Irritability. Vag. Bleeding: None.  Movement: Present. Denies leaking of fluid.  ----------------------------------------------------------------------------------- The following portions of the patient's history were reviewed and updated as appropriate: allergies, current medications, past family history, past medical history, past social history, past surgical history and problem list. Problem list updated.   Objective  Blood pressure 136/72, weight 202 lb (91.6 kg), last menstrual period 01/22/2017. Pregravid weight 159 lb (72.1 kg) Total Weight Gain 43 lb (19.5 kg) Urinalysis:      Fetal Status: Fetal Heart Rate (bpm): 140 Fundal Height: 38 cm Movement: Present     General:  Alert, oriented and cooperative. Patient is in no acute distress.  Skin: Skin is warm and dry. No rash noted.   Cardiovascular: Normal heart rate noted  Respiratory: Normal respiratory effort, no problems with respiration noted  Abdomen: Soft, gravid, appropriate for gestational age. Pain/Pressure: Absent     Pelvic:  Cervical exam performed Dilation: 1 Effacement (%): 50 Station: -3 No pooling, no ferning seen on slide, Nitrazine negative.   Extremities: Normal range of motion.     ental Status: Normal mood and affect. Normal behavior. Normal judgment and thought content.     Assessment   27 y.o. G1P0 at [redacted]w[redacted]d by  10/29/2017, by Last Menstrual Period presenting for routine prenatal visit  Plan    first pregnancy Problems (from 03/22/17 to present)    Problem Noted Resolved   Positive GBS test 10/22/2017 by Natale Milch, MD No   Supervision of low-risk pregnancy 03/23/2017 by Farrel Conners, CNM No   Overview Addendum 10/22/2017  4:45 PM by Natale Milch, MD     [facility]inic [facility] Prenatal Labs  Dating LMP=12wk3d ultrasound Blood type: O/Positive/-- (06/28 1553)   Genetic Screen 1 Screen:    AFP:     Quad:     NIPS: Antibody:Negative (06/28 1553)  Anatomic [place] normal Rubella: 1.42 (06/28 1553)  GTT Early:               Third trimester: 100 RPR: Non Reactive (06/28 1553)   Flu vaccine  HBsAg: Negative (06/28 1553)   TDaP vaccine   08/27/2017                                            Rhogam:Not needed HIV: Non Reactive (06/28 1553)   Baby Food     Breast                                          GBS:  Positive 1/7  Contraception  Nexplanon Pap: LGSIL 12/17, colpo: +HPV changes  Circumcision  Varicella: Immune  Pediatrician  GC/ Chlamydia neg/neg  Support Person  UDS negative   Repeat Pap postpartum             Term labor symptoms and general obstetric precautions including but not limited to vaginal bleeding, contractions, leaking  of fluid and fetal movement were reviewed in detail with the patient. Please refer to After Visit Summary for other counseling recommendations.   No sign of rupture, reassurance given.   Return in about 1 week (around 10/22/2017) for ROB.  Adelene Idlerhristanna Schuman MD

## 2017-10-22 ENCOUNTER — Ambulatory Visit (INDEPENDENT_AMBULATORY_CARE_PROVIDER_SITE_OTHER): Payer: BLUE CROSS/BLUE SHIELD | Admitting: Obstetrics and Gynecology

## 2017-10-22 ENCOUNTER — Encounter: Payer: Self-pay | Admitting: Obstetrics and Gynecology

## 2017-10-22 VITALS — BP 118/78 | Wt 203.0 lb

## 2017-10-22 DIAGNOSIS — Z3A39 39 weeks gestation of pregnancy: Secondary | ICD-10-CM

## 2017-10-22 DIAGNOSIS — Z3493 Encounter for supervision of normal pregnancy, unspecified, third trimester: Secondary | ICD-10-CM

## 2017-10-22 DIAGNOSIS — B951 Streptococcus, group B, as the cause of diseases classified elsewhere: Secondary | ICD-10-CM

## 2017-10-22 NOTE — Progress Notes (Signed)
Routine Prenatal Care Visit  Subjective  Anzley Dibbern is a 27 y.o. G1P0 at [redacted]w[redacted]d being seen today for ongoing prenatal care.  She is currently monitored for the following issues for this low-risk pregnancy and has History of abnormal cervical Pap smear; Supervision of low-risk pregnancy; Migraine headache; Gastroesophageal reflux disease; and Positive GBS test on their problem list.  ----------------------------------------------------------------------------------- Patient reports no complaints.  Pt reports no problems. Denies any ctx. Feels uncomfortable. Desires cervical check and membrane sweeping. Contractions: Not present. Vag. Bleeding: None.  Movement: Present. Denies leaking of fluid.  ----------------------------------------------------------------------------------- The following portions of the patient's history were reviewed and updated as appropriate: allergies, current medications, past family history, past medical history, past social history, past surgical history and problem list. Problem list updated.   Objective  Blood pressure 118/78, weight 203 lb (92.1 kg), last menstrual period 01/22/2017. Pregravid weight 159 lb (72.1 kg) Total Weight Gain 44 lb (20 kg) Urinalysis: Urine Protein: Negative Urine Glucose: Negative  Fetal Status: Fetal Heart Rate (bpm): 135 Fundal Height: 38 cm Movement: Present  Presentation: Vertex  General:  Alert, oriented and cooperative. Patient is in no acute distress.  Skin: Skin is warm and dry. No rash noted.   Cardiovascular: Normal heart rate noted  Respiratory: Normal respiratory effort, no problems with respiration noted  Abdomen: Soft, gravid, appropriate for gestational age. Pain/Pressure: Absent     Pelvic:  Cervical exam deferred Dilation: 1 Effacement (%): 50 Station: -3 Membranes swept per patient request.  Extremities: Normal range of motion.     ental Status: Normal mood and affect. Normal behavior. Normal judgment and  thought content.     Assessment   27 y.o. G1P0 at [redacted]w[redacted]d by  10/29/2017, by Last Menstrual Period presenting for routine prenatal visit  Plan   first pregnancy Problems (from 03/22/17 to present)    Problem Noted Resolved   Positive GBS test 10/22/2017 by Natale Milch, MD No   Supervision of low-risk pregnancy 03/23/2017 by Farrel Conners, CNM No   Overview Addendum 10/22/2017  4:45 PM by Natale Milch, MD     [facility]inic [facility] Prenatal Labs  Dating LMP=12wk3d ultrasound Blood type: O/Positive/-- (06/28 1553)   Genetic Screen 1 Screen:    AFP:     Quad:     NIPS: Antibody:Negative (06/28 1553)  Anatomic [place] normal Rubella: 1.42 (06/28 1553)  GTT Early:               Third trimester: 100 RPR: Non Reactive (06/28 1553)   Flu vaccine  HBsAg: Negative (06/28 1553)   TDaP vaccine   08/27/2017                                            Rhogam:Not needed HIV: Non Reactive (06/28 1553)   Baby Food     Breast                                          GBS:  Positive 1/7  Contraception  Nexplanon Pap: LGSIL 12/17, colpo: +HPV changes  Circumcision  Varicella: Immune  Pediatrician  GC/ Chlamydia neg/neg  Support Person  UDS negative   Repeat Pap postpartum             Term labor symptoms  and general obstetric precautions including but not limited to vaginal bleeding, contractions, leaking of fluid and fetal movement were reviewed in detail with the patient. Please refer to After Visit Summary for other counseling recommendations.   Return in about 1 week (around 10/29/2017) for ROB.  Adelene Idlerhristanna Schuman MD 10/22/17 4:47 PM

## 2017-10-22 NOTE — Patient Instructions (Signed)

## 2017-10-23 ENCOUNTER — Encounter: Payer: Self-pay | Admitting: Obstetrics and Gynecology

## 2017-10-29 ENCOUNTER — Ambulatory Visit (INDEPENDENT_AMBULATORY_CARE_PROVIDER_SITE_OTHER): Payer: BLUE CROSS/BLUE SHIELD | Admitting: Obstetrics and Gynecology

## 2017-10-29 VITALS — BP 118/78 | Wt 206.0 lb

## 2017-10-29 DIAGNOSIS — Z3493 Encounter for supervision of normal pregnancy, unspecified, third trimester: Secondary | ICD-10-CM

## 2017-10-29 DIAGNOSIS — B951 Streptococcus, group B, as the cause of diseases classified elsewhere: Secondary | ICD-10-CM

## 2017-10-29 DIAGNOSIS — Z3A40 40 weeks gestation of pregnancy: Secondary | ICD-10-CM

## 2017-10-29 NOTE — Progress Notes (Signed)
ROB Swelling in legs

## 2017-10-29 NOTE — Progress Notes (Signed)
  [facility] IN[facility] SCHEDULING Marissa MarrowBrittany Herman Jan 30, 1991 Medical record #: [id number] Phone #:   Home Phone [phone]    Prenatal Provider:"[facility] Delivering Group:[facility] Proposed admission date/time: 11/03/17 Method of induction:Cytotec  Weight: Filed Weights02/04/19 0813Weight:206 lb (93.4 kg) BMI Body mass index is 33.25 kg/m.,". HIV Negative HSV Negative EDC Estimated Date of Delivery: 2/4/19based on:LMP  Gestational age on admission: 5883w5d Gravidity/parity:G1P0  Cervix Score   0 1 2 3   Position Posterior Midposition Anterior   Consistency Firm Medium Soft   Effacement (%) 0-30 40-50 60-70 >80  Dilation (cm) Closed 1-2 3-4 >5  Baby's station -3 -2 -1 +1, +2   Bishop Score:3   Medical induction of labor  select indication(s) below Elective induction ?39 weeks multiparous patient ?39 weeks primiparous patient with Bishop score ?7 ?40 weeks primiparous patient   Medical Indications Adapted from ACOG Committee Opinion #[id number], "Medically Indicated Late Preterm and Early Term Deliveries," 2013.  PLACENTAL / UTERINE ISSUES FETAL ISSUES MATERNAL ISSUES  ? Placenta previa (36.0-37.6) ? Isoimmunization (37.0-38.6) ? Preeclampsia without severe features or gestational HTN (37.0)  ? Suspected accreta (34.0-35.6) ? Growth Restriction Mason Jim([place]) ? Preeclampsia with severe features (34.0)  ? Prior classical CD, uterine window, rupture (36.0-37.6) ? Isolated (38.0-39.6) ? Chronic HTN (38.0-39.6)  ? Prior myomectomy (37.0-38.6) ? Concurrent findings (34.0-37.6) ? Cholestasis (37.0)  ? Umbilical vein varix (37.0) ? Growth Restriction (Twins) ? Diabetes  ? Placental abruption (chronic) ? Di-Di Isolated (36.0-37.6) ? Pregestational, controlled (39.0)  OBSTETRIC ISSUES ? Di-Di concurrent findings (32.0-34.6) ? Pregestational, uncontrolled (37.0-39.0)  ? Postdates ? (41 weeks) ? Mo-Di isolated (32.0-34.6) ? Pregestational, vascular compromise (37.0-  39.0)  ? PPROM (34.0) ? Multiple Gestation ? Gestational, diet controlled (40.0)  ? Hx of IUFD (39.0 weeks) ? Di-Di (38.0-38.6) ? Gestational, med controlled (39.0)  ? Polyhydramnios, mild/moderate; SDV 8-16 or AFI 25-35 (39.0) ? Mo-Di (36.0-37.6) ? Gestational, uncontrolled (38.0-39.0)  ? Oligohydramnios (36.0-37.6); MVP <2 cm  For indications not listed above, delivery recommendations from maternal-fetal medicine consultant occurred on: N/A  Provider Signature: Vena Austriandreas Shamon Lobo Scheduled by:AMS Date:10/29/2017 8:34 AM   Call [phone] to finalize the induction date/time  [id number] (07/17)

## 2017-10-29 NOTE — Progress Notes (Signed)
Obstetric H&P   Chief Complaint: Routine prenatal schedule Induction  Prenatal Care Provider: [facility]  History of Present Illness: 27 y.o. G1P0 [redacted]w[redacted]d by 10/29/2017, by Last Menstrual Period = 12 week [place] presenting today to scheduled IOL and ROB.  +FM, no LOF, no VB, no ctx.  Prenatal care uncomplicated.  Pregravid weight 159 lb (72.1 kg) Total Weight Gain 47 lb (21.3 kg)  first pregnancy Problems (from 03/22/17 to present)    Problem Noted Resolved   Positive GBS test 10/22/2017 by Natale Milch, MD No   Supervision of low-risk pregnancy 03/23/2017 by Farrel Conners, CNM No   Overview Addendum 10/22/2017  4:45 PM by Natale Milch, MD     Clinic [facility] Prenatal Labs  Dating LMP=12wk3d ultrasound Blood type: O/Positive/-- (06/28 1553)   Genetic Screen 1 Screen: negative Antibody:Negative (06/28 1553)  Anatomic [place] Normal (surprise gender female) Rubella: 1.42 (06/28 1553)  GTT Third trimester: 100 RPR: Non Reactive (06/28 1553)   Flu vaccine Did not receive HBsAg: Negative (06/28 1553)   TDaP  08/27/2017                                            Rhogam:Not needed HIV: Non Reactive (06/28 1553)   Baby Food Breast                                          GBS:  Positive 1/7  Contraception Nexplanon Pap: LGSIL 12/17, colpo: +HPV changes  Circumcision  Varicella: Immune  Pediatrician  GC/ Chlamydia neg/neg  Support Person  UDS negative   Repeat Pap postpartum             Review of Systems: 10 point review of systems negative unless otherwise noted in HPI  Past Medical History: Past Medical History:  Diagnosis Date  . History of abnormal cervical Pap smear 09/10/2016   LGSIL/ biopsy +HPV changes    Past Surgical History: Past Surgical History:  Procedure Laterality Date  . COLPOSCOPY  11/10/2016   bethesda LSIL  . WISDOM TOOTH EXTRACTION     Family History: Family History  Problem Relation Age of Onset  . Diabetes Father   . Heart disease Maternal  Grandmother   . Hypertension Maternal Grandmother   . Stroke Maternal Grandmother   . Heart disease Paternal Grandfather        Had heart attack    Social History: Social History   Socioeconomic History  . Marital status: Single    Spouse name: Not on file  . Number of children: Not on file  . Years of education: Not on file  . Highest education level: Not on file  Social Needs  . Financial resource strain: Not on file  . Food insecurity - worry: Not on file  . Food insecurity - inability: Not on file  . Transportation needs - medical: Not on file  . Transportation needs - non-medical: Not on file  Occupational History  . Not on file  Tobacco Use  . Smoking status: Former Smoker    Types: Cigarettes  . Smokeless tobacco: Never Used  . Tobacco comment: 1 pack/week of cigarettes. Quit March 2018  Substance and Sexual Activity  . Alcohol use: No    Comment: former  . Drug use:  No  . Sexual activity: Yes    Partners: Male    Birth control/protection: None  Other Topics Concern  . Not on file  Social History Narrative  . Not on file    Medications: Prior to Admission medications   Medication Sig Start Date End Date Taking? Authorizing Provider  pantoprazole (PROTONIX) 40 MG tablet Take 1 tablet (40 mg total) by mouth 2 (two) times daily before a meal. 07/13/17  Yes Farrel ConnersGutierrez, Colleen, CNM  Prenatal MV-Min-FA-Omega-3 (PRENATAL GUMMIES/DHA & FA PO) Take 1 tablet by mouth.   Yes [provider]    Allergies: Allergies  Allergen Reactions  . Other Rash    Nitrile gloves    Physical Exam: Vitals: Blood pressure 118/78, weight 206 lb (93.4 kg), last menstrual period 01/22/2017.  FHT: 145  General: NAD HEENT: normocephalic, anicteric Pulmonary: No increased work of breathing Cardiovascular: RRR, distal pulses 2+ Abdomen: Gravid, non-tender Leopolds: vtx Genitourinary: 2/50/-3 Extremities: no edema, erythema, or tenderness Neurologic: Grossly  intact Psychiatric: mood appropriate, affect full  Labs: No results found for this or any previous visit (from the past 24 hour(s)).  Assessment: 27 y.o. G1P0 5464w0d by 10/29/2017, by Last Menstrual Period = 12 week US routine prenatal visit  Plan: 1) Patient scheduled for IOL on 11/03/17 if not in labor before then.    2) Fetus - FHT 145  3) PNL - Blood type O/Positive/-- (06/28 1553) / Anti-bodyscreen Negative (06/28 1553) / Rubella 1.42 (06/28 1553) Immune / Varicella Immune / RPR Non Reactive (11/19 16100938) / HBsAg Negative (06/28 1553) / HIV Non Reactive (11/19 96040938) / 1-hr OGTT100 / GBS Positive (01/07 1648)  4) Immunization History -  Immunization History  Administered Date(s) Administered  . Tdap 08/27/2017    5) Disposition - pending delivery

## 2017-10-29 NOTE — Patient Instructions (Signed)
Your induction has been scheduled for Saturday 11/03/2017 at 08:00 AM.  Register at the ER that morning to be brought up to labor and delivery.

## 2017-10-30 ENCOUNTER — Other Ambulatory Visit: Payer: Self-pay

## 2017-10-30 ENCOUNTER — Inpatient Hospital Stay
Admission: EM | Admit: 2017-10-30 | Discharge: 2017-11-02 | DRG: 807 | Disposition: A | Discharge status: Home / Self Care | Payer: BLUE CROSS/BLUE SHIELD | Attending: Obstetrics and Gynecology | Admitting: Obstetrics and Gynecology

## 2017-10-30 DIAGNOSIS — O9962 Diseases of the digestive system complicating childbirth: Secondary | ICD-10-CM | POA: Diagnosis not present

## 2017-10-30 DIAGNOSIS — B951 Streptococcus, group B, as the cause of diseases classified elsewhere: Secondary | ICD-10-CM

## 2017-10-30 DIAGNOSIS — Z3A40 40 weeks gestation of pregnancy: Secondary | ICD-10-CM | POA: Diagnosis not present

## 2017-10-30 DIAGNOSIS — Z3490 Encounter for supervision of normal pregnancy, unspecified, unspecified trimester: Secondary | ICD-10-CM

## 2017-10-30 DIAGNOSIS — Z3493 Encounter for supervision of normal pregnancy, unspecified, third trimester: Secondary | ICD-10-CM

## 2017-10-30 DIAGNOSIS — K219 Gastro-esophageal reflux disease without esophagitis: Secondary | ICD-10-CM | POA: Diagnosis present

## 2017-10-30 DIAGNOSIS — Z87891 Personal history of nicotine dependence: Secondary | ICD-10-CM

## 2017-10-30 DIAGNOSIS — O99824 Streptococcus B carrier state complicating childbirth: Secondary | ICD-10-CM | POA: Diagnosis present

## 2017-10-30 MED ORDER — LIDOCAINE HCL (PF) 1 % IJ SOLN
INTRAMUSCULAR | Status: AC
  Filled 2017-10-30: qty 30

## 2017-10-30 MED ORDER — MISOPROSTOL 200 MCG PO TABS
ORAL_TABLET | ORAL | Status: AC
  Filled 2017-10-30: qty 4

## 2017-10-30 MED ORDER — OXYTOCIN 10 UNIT/ML IJ SOLN
INTRAMUSCULAR | Status: AC
  Filled 2017-10-30: qty 2

## 2017-10-30 MED ORDER — AMMONIA AROMATIC IN INHA
RESPIRATORY_TRACT | Status: AC
  Filled 2017-10-30: qty 10

## 2017-10-30 NOTE — OB Triage Note (Signed)
Pt presents with a complaint of leaking fluids and contractions for the last 2 hours. Fluid is clear in color and a steady flow. Due date 10/29/2017, last office visit yesterday and MD stripped her membranes.

## 2017-10-30 NOTE — H&P (Signed)
OB History & Physical   History of Present Illness:  Chief Complaint: contractions  HPI:  Marissa Herman is a 27 y.o. G1P0 female at [redacted]w[redacted]d dated by LMP consistent with a 12 week ultrasound.  Her pregnancy has been uncomplicated.    She reports contractions.   She denies leakage of fluid.   She denies vaginal bleeding.   She reports fetal movement.    Maternal Medical History:   Past Medical History:  Diagnosis Date  . History of abnormal cervical Pap smear 09/10/2016   LGSIL/ biopsy +HPV changes    Past Surgical History:  Procedure Laterality Date  . COLPOSCOPY  11/10/2016   bethesda LSIL  . WISDOM TOOTH EXTRACTION      Allergies  Allergen Reactions  . Other Rash    Nitrile gloves    Prior to Admission medications   Medication Sig Start Date End Date Taking? Authorizing Provider  pantoprazole (PROTONIX) 40 MG tablet Take 1 tablet (40 mg total) by mouth 2 (two) times daily before a meal. 07/13/17   Farrel Conners, CNM  Prenatal MV-Min-FA-Omega-3 (PRENATAL GUMMIES/DHA & FA PO) Take 1 tablet by mouth.    [provider]    OB History  Gravida Para Term Preterm AB Living  1            SAB TAB Ectopic Multiple Live Births               # Outcome Date GA Lbr Len/2nd Weight Sex Delivery Anes PTL Lv  1 Current               Prenatal care site: [facility]  Social History: She  reports that she has quit smoking. Her smoking use included cigarettes. she has never used smokeless tobacco. She reports that she does not drink alcohol or use drugs.  Family History: family history includes Diabetes in her father; Heart disease in her maternal grandmother and paternal grandfather; Hypertension in her maternal grandmother; Stroke in her maternal grandmother.   Review of Systems:  Review of Systems  Constitutional: Negative.   HENT: Negative.   Eyes: Negative.   Respiratory: Negative.   Cardiovascular: Negative.   Gastrointestinal: Negative.     Genitourinary: Negative.   Musculoskeletal: Negative.   Skin: Negative.   Neurological: Negative.   Psychiatric/Behavioral: Negative.      Physical Exam:  Vital Signs: BP 119/82 (BP Location: Right Arm)   Pulse 88   Temp 98.9 F (37.2 C) (Axillary)   Resp 18   LMP 01/22/2017 (Exact Date)  Constitutional: Well nourished, well developed female in no acute distress.  HEENT: normal Skin: Warm and dry.  Cardiovascular: Regular rate and rhythm.   Extremity: no edema  Respiratory: Clear to auscultation bilateral. Normal respiratory effort Abdomen: gravid/NT Back: no CVAT Neuro: DTRs 2+, Cranial nerves grossly intact Psych: Alert and Oriented x3. No memory deficits. Normal mood and affect.  MS: normal gait, normal bilateral lower extremity ROM/strength/stability.  Pelvic exam: (female chaperone present) is not limited by body habitus EGBUS: within normal limits Vagina: within normal limits and with normal mucosa blood in the vault Cervix: 3/50/-3/posterior/soft   Pertinent Results:  Prenatal Labs: Blood type/Rh O positive  Antibody screen negative  Rubella Not immune  Varicella Immune    RPR NR  HBsAg negative  HIV negative  GC negative  Chlamydia negative  Genetic screening 1st trim screen neg, no msAFP  1 hour GTT 100  3 hour GTT n/a  GBS positive on 10/01/17  Baseline FHR: 155 beats/min   Variability: moderate   Accelerations: present   Decelerations: absent Contractions: present frequency: 3 q 10 min Overall assessment: cat 1 (OF note: there was a 2-hour period of fetal heart rate baseline 180 with moderate variability, no decels)  Assessment:  Marissa Herman is a 27 y.o. G1P0 female at 9391w1d with active labor, period of category 2 fetal tracing.   Plan:  1. Admit to Labor & Delivery  2. CBC, T&S, Clrs, IVF 3. GBS positive - PCN per protocol 4. Fetwal well-being: reassuring overall.  Given her gestational age and category 2 fetal heart rate tracing for about  2 hours, will not send patient home. She is 3 cm dilated and in early labor. Will keep and augment, if needed.   Thomasene MohairStephen Valma Rotenberg, MD 10/30/2017 11:56 PM

## 2017-10-31 ENCOUNTER — Inpatient Hospital Stay: Payer: BLUE CROSS/BLUE SHIELD | Admitting: Registered Nurse

## 2017-10-31 ENCOUNTER — Other Ambulatory Visit: Payer: Self-pay

## 2017-10-31 DIAGNOSIS — Z87891 Personal history of nicotine dependence: Secondary | ICD-10-CM | POA: Diagnosis not present

## 2017-10-31 DIAGNOSIS — Z3A40 40 weeks gestation of pregnancy: Secondary | ICD-10-CM | POA: Diagnosis not present

## 2017-10-31 DIAGNOSIS — O9962 Diseases of the digestive system complicating childbirth: Secondary | ICD-10-CM | POA: Diagnosis present

## 2017-10-31 DIAGNOSIS — O99824 Streptococcus B carrier state complicating childbirth: Secondary | ICD-10-CM | POA: Diagnosis present

## 2017-10-31 DIAGNOSIS — Z3483 Encounter for supervision of other normal pregnancy, third trimester: Secondary | ICD-10-CM | POA: Diagnosis present

## 2017-10-31 DIAGNOSIS — K219 Gastro-esophageal reflux disease without esophagitis: Secondary | ICD-10-CM | POA: Diagnosis present

## 2017-10-31 LAB — CBC
HCT: 32.8 % — ABNORMAL LOW (ref 35.0–47.0)
Hemoglobin: 11.2 g/dL — ABNORMAL LOW (ref 12.0–16.0)
MCH: 30 pg (ref 26.0–34.0)
MCHC: 34.2 g/dL (ref 32.0–36.0)
MCV: 87.8 fL (ref 80.0–100.0)
Platelets: 240 10*3/uL (ref 150–440)
RBC: 3.74 MIL/uL — ABNORMAL LOW (ref 3.80–5.20)
RDW: 12.5 % (ref 11.5–14.5)
WBC: 16.3 10*3/uL — ABNORMAL HIGH (ref 3.6–11.0)

## 2017-10-31 LAB — TYPE AND SCREEN
ABO/RH(D): O POS
Antibody Screen: NEGATIVE

## 2017-10-31 MED ORDER — LIDOCAINE-EPINEPHRINE (PF) 1.5 %-1:200000 IJ SOLN
INTRAMUSCULAR | Status: DC | PRN
  Administered 2017-10-31: 3 mL via PERINEURAL

## 2017-10-31 MED ORDER — OXYTOCIN 40 UNITS IN LACTATED RINGERS INFUSION - SIMPLE MED
1.0000 m[IU]/min | INTRAVENOUS | Status: DC
  Administered 2017-10-31: 1 m[IU]/min via INTRAVENOUS
  Administered 2017-10-31: 15 m[IU]/min via INTRAVENOUS
  Filled 2017-10-31: qty 1000

## 2017-10-31 MED ORDER — OXYTOCIN 40 UNITS IN LACTATED RINGERS INFUSION - SIMPLE MED
2.5000 [IU]/h | INTRAVENOUS | Status: DC
  Filled 2017-10-31 (×2): qty 1000

## 2017-10-31 MED ORDER — EPHEDRINE 5 MG/ML INJ
10.0000 mg | INTRAVENOUS | Status: DC | PRN
  Filled 2017-10-31: qty 2

## 2017-10-31 MED ORDER — LACTATED RINGERS IV SOLN
INTRAVENOUS | Status: DC
  Administered 2017-10-31: 05:00:00 via INTRAVENOUS

## 2017-10-31 MED ORDER — LIDOCAINE HCL (PF) 1 % IJ SOLN
30.0000 mL | INTRAMUSCULAR | Status: DC | PRN
  Filled 2017-10-31: qty 30

## 2017-10-31 MED ORDER — LACTATED RINGERS IV SOLN: 500.0000 mL | INTRAVENOUS | Status: DC | PRN

## 2017-10-31 MED ORDER — OXYTOCIN 40 UNITS IN LACTATED RINGERS INFUSION - SIMPLE MED
1.0000 m[IU]/min | INTRAVENOUS | Status: DC
  Administered 2017-10-31: 17 m[IU]/min via INTRAVENOUS

## 2017-10-31 MED ORDER — LIDOCAINE HCL (PF) 1 % IJ SOLN
INTRAMUSCULAR | Status: DC | PRN
  Administered 2017-10-31: 3 mL
  Administered 2017-10-31: 3 mL via SUBCUTANEOUS

## 2017-10-31 MED ORDER — PHENYLEPHRINE 40 MCG/ML (10ML) SYRINGE FOR IV PUSH (FOR BLOOD PRESSURE SUPPORT)
80.0000 ug | PREFILLED_SYRINGE | INTRAVENOUS | Status: DC | PRN
  Filled 2017-10-31: qty 5

## 2017-10-31 MED ORDER — LACTATED RINGERS IV SOLN: 500.0000 mL | Freq: Once | INTRAVENOUS | Status: DC

## 2017-10-31 MED ORDER — TERBUTALINE SULFATE 1 MG/ML IJ SOLN: 0.2500 mg | Freq: Once | INTRAMUSCULAR | Status: DC | PRN

## 2017-10-31 MED ORDER — OXYTOCIN BOLUS FROM INFUSION: 500.0000 mL | Freq: Once | INTRAVENOUS | Status: DC

## 2017-10-31 MED ORDER — FENTANYL 2.5 MCG/ML W/ROPIVACAINE 0.15% IN NS 100 ML EPIDURAL (ARMC)
12.0000 mL/h | EPIDURAL | Status: DC
  Administered 2017-10-31: 12 mL/h via EPIDURAL
  Filled 2017-10-31 (×3): qty 100

## 2017-10-31 MED ORDER — OXYTOCIN 10 UNIT/ML IJ SOLN
10.0000 [IU] | Freq: Once | INTRAMUSCULAR | Status: DC
  Filled 2017-10-31: qty 1

## 2017-10-31 MED ORDER — ONDANSETRON HCL 4 MG/2ML IJ SOLN
4.0000 mg | Freq: Four times a day (QID) | INTRAMUSCULAR | Status: DC | PRN
  Administered 2017-10-31: 4 mg via INTRAVENOUS
  Filled 2017-10-31: qty 2

## 2017-10-31 MED ORDER — BUPIVACAINE HCL (PF) 0.25 % IJ SOLN
INTRAMUSCULAR | Status: DC | PRN
  Administered 2017-10-31 (×2): 4 mL via EPIDURAL
  Administered 2017-10-31: 5 mL via EPIDURAL
  Administered 2017-10-31: 3 mL via EPIDURAL

## 2017-10-31 MED ORDER — SOD CITRATE-CITRIC ACID 500-334 MG/5ML PO SOLN: 30.0000 mL | ORAL | Status: DC | PRN

## 2017-10-31 MED ORDER — SODIUM CHLORIDE 0.9 % IV SOLN
INTRAVENOUS | Status: DC | PRN
  Administered 2017-10-31 – 2017-11-01 (×4): 5 mL via EPIDURAL

## 2017-10-31 MED ORDER — DIPHENHYDRAMINE HCL 50 MG/ML IJ SOLN
12.5000 mg | INTRAMUSCULAR | Status: AC | PRN
  Administered 2017-10-31 – 2017-11-01 (×3): 12.5 mg via INTRAVENOUS
  Filled 2017-10-31 (×2): qty 1

## 2017-10-31 MED ORDER — DEXTROSE 5 % IV SOLN
5.0000 10*6.[IU] | Freq: Once | INTRAVENOUS | Status: AC
  Administered 2017-10-31: 5 10*6.[IU] via INTRAVENOUS
  Filled 2017-10-31: qty 5

## 2017-10-31 MED ORDER — ACETAMINOPHEN 325 MG PO TABS
ORAL_TABLET | ORAL | Status: AC
  Filled 2017-10-31: qty 2

## 2017-10-31 MED ORDER — PENICILLIN G POT IN DEXTROSE 60000 UNIT/ML IV SOLN
3.0000 10*6.[IU] | INTRAVENOUS | Status: DC
  Administered 2017-10-31 – 2017-11-01 (×5): 3 10*6.[IU] via INTRAVENOUS
  Filled 2017-10-31 (×15): qty 50

## 2017-10-31 MED ORDER — FENTANYL 2.5 MCG/ML W/ROPIVACAINE 0.15% IN NS 100 ML EPIDURAL (ARMC)
EPIDURAL | Status: AC
  Filled 2017-10-31: qty 100

## 2017-10-31 NOTE — Progress Notes (Signed)
Patient ID: [name], female   DOB: December 10, 1990, 27 y.o.   MRN: [id number]  Discussed with patient her options of continuing with induction of labor or stopping induction and being discharged home. Patient would like to continue with induction at this time. She understands that inductions can be a long process that sometimes take 2-3 days. She does not desire an epidural at this time. Will allow her to have a regular diet.  FHR: Baseline 150, moderate variability, no deceleration, 15x15 accelerations Toco: contractions every 2-3 minutes   [name] MD 10/31/17 11:47 AM

## 2017-10-31 NOTE — Anesthesia Procedure Notes (Deleted)
Performed by: Aritza Brunet, CRNA       

## 2017-10-31 NOTE — Progress Notes (Signed)
Patient ID: Marissa MarrowBrittany Mcelmurry, female   DOB: [birth year], 27 y.o.   MRN: [id number] Patient doing well. Strip reviewed. Will continue with induction. Increase pitocin to 6225mu/hr  max. Patient declining rupture of membranes at this time.  FHR 150s, moderate variability, 15x15 accelerations, variable decelerations resolved with position changes.  Toco q 2-3 minutes Kristjan Derner MD 10/31/17 10:51 PM

## 2017-10-31 NOTE — Anesthesia Procedure Notes (Signed)
Epidural Patient location during procedure: OB  Staffing Anesthesiologist: Lenard SimmerKarenz, Andrew, MD Resident/CRNA: Mathews ArgyleLogan, Azhia Siefken, CRNA Performed: resident/CRNA   Preanesthetic Checklist Completed: patient identified, site marked, surgical consent, pre-op evaluation, timeout performed, IV checked, risks and benefits discussed and monitors and equipment checked  Epidural Patient position: sitting Prep: ChloraPrep and site prepped and draped Patient monitoring: heart rate, continuous pulse ox and blood pressure Approach: midline Location: L3-L4 Injection technique: LOR saline  Needle:  Needle type: Tuohy  Needle gauge: 17 G Needle length: 9 cm and 9 Needle insertion depth: 7.5 cm Catheter type: closed end flexible Catheter size: 19 Gauge Catheter at skin depth: 12 cm Test dose: negative  Assessment Events: blood not aspirated, injection not painful, no injection resistance, negative IV test and no paresthesia  Additional Notes   Patient tolerated the insertion well without complications.Reason for block:procedure for pain

## 2017-10-31 NOTE — Anesthesia Preprocedure Evaluation (Signed)
Anesthesia Evaluation  Patient identified by MRN, date of birth, ID band Patient awake    Reviewed: Allergy & Precautions, H&P , NPO status , Patient's Chart, lab work & pertinent test results  History of Anesthesia Complications Negative for: history of anesthetic complications  Airway Mallampati: II  TM Distance: >3 FB Neck ROM: full    Dental no notable dental hx.    Pulmonary former smoker,    Pulmonary exam normal        Cardiovascular Normal cardiovascular exam     Neuro/Psych    GI/Hepatic Neg liver ROS, GERD  ,  Endo/Other  negative endocrine ROS  Renal/GU negative Renal ROS     Musculoskeletal   Abdominal   Peds  Hematology negative hematology ROS (+)   Anesthesia Other Findings   Reproductive/Obstetrics (+) Pregnancy                             Anesthesia Physical Anesthesia Plan  ASA: II  Anesthesia Plan: Epidural   Post-op Pain Management:    Induction:   PONV Risk Score and Plan:   Airway Management Planned:   Additional Equipment:   Intra-op Plan:   Post-operative Plan:   Informed Consent: I have reviewed the patients History and Physical, chart, labs and discussed the procedure including the risks, benefits and alternatives for the proposed anesthesia with the patient or authorized representative who has indicated his/her understanding and acceptance.     Plan Discussed with: Anesthesiologist  Anesthesia Plan Comments:         Anesthesia Quick Evaluation

## 2017-10-31 NOTE — Anesthesia Procedure Notes (Signed)
Epidural Patient location during procedure: OB Start time: 10/31/2017 2:02 PM End time: 10/31/2017 2:14 PM  Staffing Anesthesiologist: Yves Dillarroll, Paul, MD Resident/CRNA: Stormy Fabianurtis, Priseis Cratty, CRNA Performed: anesthesiologist   Preanesthetic Checklist Completed: patient identified, site marked, surgical consent, pre-op evaluation, timeout performed, IV checked, risks and benefits discussed and monitors and equipment checked  Epidural Patient position: sitting Prep: Betadine Patient monitoring: heart rate, continuous pulse ox and blood pressure Approach: midline Location: L4-L5 Injection technique: LOR saline  Needle:  Needle type: Tuohy  Needle gauge: 17 G Needle length: 9 cm and 9 Needle insertion depth: 7 cm Catheter type: closed end flexible Catheter size: 19 Gauge Catheter at skin depth: 11 cm Test dose: negative and 1.5% lidocaine with Epi 1:200 K  Assessment Sensory level: T10 Events: blood not aspirated, injection not painful, no injection resistance, negative IV test and no paresthesia  Additional Notes Pt. Evaluated and documentation done after procedure finished. Patient identified. Risks/Benefits/Options discussed with patient including but not limited to bleeding, infection, nerve damage, paralysis, failed block, incomplete pain control, headache, blood pressure changes, nausea, vomiting, reactions to medication both or allergic, itching and postpartum back pain. Confirmed with bedside nurse the patient's most recent platelet count. Confirmed with patient that they are not currently taking any anticoagulation, have any bleeding history or any family history of bleeding disorders. Patient expressed understanding and wished to proceed. All questions were answered. Sterile technique was used throughout the entire procedure. Please see nursing notes for vital signs. Test dose was given through epidural catheter and negative prior to continuing to dose epidural or start infusion.  Warning signs of high block given to the patient including shortness of breath, tingling/numbness in hands, complete motor block, or any concerning symptoms with instructions to call for help. Patient was given instructions on fall risk and not to get out of bed. All questions and concerns addressed with instructions to call with any issues or inadequate analgesia.   Patient tolerated the insertion well without immediate complications.Reason for block:procedure for pain

## 2017-11-01 DIAGNOSIS — Z3A40 40 weeks gestation of pregnancy: Secondary | ICD-10-CM

## 2017-11-01 DIAGNOSIS — O9962 Diseases of the digestive system complicating childbirth: Secondary | ICD-10-CM

## 2017-11-01 LAB — RPR: RPR Ser Ql: NONREACTIVE

## 2017-11-01 MED ORDER — OXYCODONE-ACETAMINOPHEN 5-325 MG PO TABS: 2.0000 | ORAL_TABLET | ORAL | Status: DC | PRN

## 2017-11-01 MED ORDER — COCONUT OIL OIL
1.0000 "application " | TOPICAL_OIL | Status: DC | PRN
  Filled 2017-11-01: qty 120

## 2017-11-01 MED ORDER — BENZOCAINE-MENTHOL 20-0.5 % EX AERO
1.0000 "application " | INHALATION_SPRAY | CUTANEOUS | Status: DC | PRN
  Filled 2017-11-01: qty 56

## 2017-11-01 MED ORDER — SIMETHICONE 80 MG PO CHEW: 80.0000 mg | CHEWABLE_TABLET | ORAL | Status: DC | PRN

## 2017-11-01 MED ORDER — OXYCODONE-ACETAMINOPHEN 5-325 MG PO TABS
1.0000 | ORAL_TABLET | ORAL | Status: DC | PRN
  Administered 2017-11-01 – 2017-11-02 (×2): 1 via ORAL
  Filled 2017-11-01 (×2): qty 1

## 2017-11-01 MED ORDER — SENNOSIDES-DOCUSATE SODIUM 8.6-50 MG PO TABS
2.0000 | ORAL_TABLET | ORAL | Status: DC
  Administered 2017-11-02: 2 via ORAL
  Filled 2017-11-01: qty 2

## 2017-11-01 MED ORDER — DIBUCAINE 1 % RE OINT: 1.0000 "application " | TOPICAL_OINTMENT | RECTAL | Status: DC | PRN

## 2017-11-01 MED ORDER — IBUPROFEN 600 MG PO TABS
600.0000 mg | ORAL_TABLET | Freq: Four times a day (QID) | ORAL | Status: DC
  Administered 2017-11-01 – 2017-11-02 (×6): 600 mg via ORAL
  Filled 2017-11-01 (×7): qty 1

## 2017-11-01 MED ORDER — METHYLERGONOVINE MALEATE 0.2 MG/ML IJ SOLN
INTRAMUSCULAR | Status: AC
  Administered 2017-11-01: 0.2 mg
  Filled 2017-11-01: qty 1

## 2017-11-01 MED ORDER — ONDANSETRON HCL 4 MG PO TABS
4.0000 mg | ORAL_TABLET | ORAL | Status: DC | PRN
  Administered 2017-11-02: 4 mg via ORAL
  Filled 2017-11-01: qty 1

## 2017-11-01 MED ORDER — ACETAMINOPHEN 325 MG PO TABS: 650.0000 mg | ORAL_TABLET | ORAL | Status: DC | PRN

## 2017-11-01 MED ORDER — WITCH HAZEL-GLYCERIN EX PADS: 1.0000 "application " | MEDICATED_PAD | CUTANEOUS | Status: DC | PRN

## 2017-11-01 MED ORDER — PRENATAL MULTIVITAMIN CH
1.0000 | ORAL_TABLET | Freq: Every day | ORAL | Status: DC
  Administered 2017-11-01 – 2017-11-02 (×2): 1 via ORAL
  Filled 2017-11-01 (×3): qty 1

## 2017-11-01 MED ORDER — ONDANSETRON HCL 4 MG/2ML IJ SOLN: 4.0000 mg | INTRAMUSCULAR | Status: DC | PRN

## 2017-11-01 MED ORDER — DIPHENHYDRAMINE HCL 25 MG PO CAPS: 25.0000 mg | ORAL_CAPSULE | Freq: Four times a day (QID) | ORAL | Status: DC | PRN

## 2017-11-01 NOTE — Discharge Summary (Signed)
Obstetric Discharge Summary Reason for Admission: onset of labor Prenatal Procedures: none Intrapartum Procedures: spontaneous vaginal delivery Postpartum Procedures: none Complications-Operative and Postpartum: perineal and periurethral laceration Hemoglobin  Date Value Ref Range Status  11/02/2017 7.9 (L) 12.0 - 16.0 g/dL Final  16/10/960411/19/2018 54.012.4 11.1 - 15.9 g/dL Final   HCT  Date Value Ref Range Status  11/02/2017 24.0 (L) 35.0 - 47.0 % Final   Hematocrit  Date Value Ref Range Status  08/13/2017 36.7 34.0 - 46.6 % Final    Physical Exam:  General: alert, cooperative, appears stated age and no distress Lochia: appropriate Uterine Fundus: firm Laceration: healing well DVT Evaluation: No evidence of DVT seen on physical exam. No cords or calf tenderness. No significant calf/ankle edema.  Discharge Diagnoses: Term Pregnancy-delivered  Discharge Information: Date: 11/02/2017 Activity: pelvic rest Diet: routine Allergies as of 11/02/2017      Reactions   Other Rash   Nitrile gloves      Medication List    TAKE these medications   famotidine 10 MG chewable tablet Commonly known as:  PEPCID AC Chew 10 mg by mouth 2 (two) times daily.   ferrous sulfate 325 (65 FE) MG tablet Commonly known as:  FERROUSUL Take 1 tablet (325 mg total) by mouth 2 (two) times daily.   ibuprofen 600 MG tablet Commonly known as:  ADVIL,MOTRIN Take 1 tablet (600 mg total) by mouth every 6 (six) hours.   Melatonin 1 MG Caps Take by mouth.   pantoprazole 40 MG tablet Commonly known as:  PROTONIX Take 1 tablet (40 mg total) by mouth daily. What changed:  when to take this   PRENATAL GUMMIES/DHA & FA PO Take 1 tablet by mouth.       Condition: stable Discharge to: home Follow-up Information    Schuman, Christanna R, MD Follow up in 6 week(s).   Specialty:  Obstetrics and Gynecology Contact information: [address] [place] [address] [phone]            Newborn Data: Live born female  Birth Weight:   APGAR: 8, 9  Newborn Delivery   Birth date/time:  11/01/2017 08:56:00 Delivery type:  Vaginal, Spontaneous     Home with mother.  Marissa Herman 11/02/2017, 8:58 AM

## 2017-11-01 NOTE — Progress Notes (Signed)
Patient ID: Ramond MarrowBrittany Meadowcroft, female   DOB: April 11, 1991, 27 y.o.   MRN: [id number]   SVE 5/70/-3 AROM, clear blood-tinged fluid.  IUPC placed. Will continue with Induction of labor, up to 30 mu/hr or pitocin.  FHR baseline 150s, moderate variability, variable decelerations with contractions. Category II tracing.  Toco: Contractions every 2-3 minutes, not adequate.  Adelene Idlerhristanna Schuman MD 11/01/17 4:59 AM

## 2017-11-01 NOTE — Progress Notes (Signed)
Patient ID: Marissa Herman, female   DOB: 1991/09/04, 27 y.o.   MRN: [id number] Contractions not adequate, will do a pitocin break for 1 hour  then restart at half and advance upward as needed for adequate contractions.  [name] MD 11/01/17 5:09 AM

## 2017-11-02 ENCOUNTER — Telehealth: Payer: Self-pay

## 2017-11-02 LAB — CBC
HCT: 24 % — ABNORMAL LOW (ref 35.0–47.0)
Hemoglobin: 7.9 g/dL — ABNORMAL LOW (ref 12.0–16.0)
MCH: 29.5 pg (ref 26.0–34.0)
MCHC: 33.1 g/dL (ref 32.0–36.0)
MCV: 89.2 fL (ref 80.0–100.0)
Platelets: 188 10*3/uL (ref 150–440)
RBC: 2.69 MIL/uL — ABNORMAL LOW (ref 3.80–5.20)
RDW: 12.5 % (ref 11.5–14.5)
WBC: 15 10*3/uL — ABNORMAL HIGH (ref 3.6–11.0)

## 2017-11-02 MED ORDER — FERROUS SULFATE 325 (65 FE) MG PO TABS: 325.0000 mg | ORAL_TABLET | Freq: Two times a day (BID) | ORAL | 1 refills | Status: DC

## 2017-11-02 MED ORDER — IBUPROFEN 600 MG PO TABS: 600.0000 mg | ORAL_TABLET | Freq: Four times a day (QID) | ORAL | 0 refills | Status: DC

## 2017-11-02 MED ORDER — PANTOPRAZOLE SODIUM 40 MG PO TBEC: 40.0000 mg | DELAYED_RELEASE_TABLET | Freq: Every day | ORAL | 1 refills | Status: DC

## 2017-11-02 NOTE — Telephone Encounter (Signed)
FMLA/DISABILITY confirmation form for [employer] filled out, signature obtained, given to [place] for processing.

## 2017-11-02 NOTE — Progress Notes (Signed)
Patient discharged home with infant. Discharge instructions, prescriptions and follow up appointment given to and reviewed with patient. Patient verbalized understanding. Patient wheeled out with infant by RN 

## 2017-11-02 NOTE — Anesthesia Postprocedure Evaluation (Signed)
Anesthesia Post Note  Patient: Marissa Herman  Procedure(s) Performed: AN AD HOC LABOR EPIDURAL  Patient location during evaluation: Mother Baby Anesthesia Type: Epidural Level of consciousness: awake, awake and alert and oriented Pain management: pain level controlled Respiratory status: spontaneous breathing Cardiovascular status: stable Postop Assessment: no headache and no backache Anesthetic complications: no     Last Vitals:  Vitals:   11/01/17 2013 11/01/17 2332  BP: 128/82 125/80  Pulse: 80 79  Resp: 18 18  Temp: 37.1 C 36.9 C  SpO2: 99% 100%    Last Pain:  Vitals:   11/02/17 0548  TempSrc:   PainSc: 0-No pain                 Claudell Wohler,  Dayle Sherpa R

## 2017-11-02 NOTE — Lactation Note (Signed)
This note was copied from a baby's chart. Lactation Consultation Note  Patient Name: Boy Ramond MarrowBrittany Kegley NWGNF'AToday's Date: 11/02/2017 Reason for consult: Follow-up assessment;Primapara Baby just had circumcison, iirritated are on tip of  both nipples  Maternal Data Formula Feeding for Exclusion: No Does the patient have breastfeeding experience prior to this delivery?: No  Feeding Feeding Type: Breast Fed Length of feed: (10 min left breast)  LATCH Score Latch: Grasps breast easily, tongue down, lips flanged, rhythmical sucking.  Audible Swallowing: A few with stimulation(sleepy baby after circ)  Type of Nipple: Everted at rest and after stimulation  Comfort (Breast/Nipple): Filling, red/small blisters or bruises, mild/mod discomfort(coconut oil given with instructions)  Hold (Positioning): Assistance needed to correctly position infant at breast and maintain latch.  LATCH Score: 7  Interventions Interventions: Assisted with latch;Skin to skin;Adjust position;Position options;Coconut oil  Lactation Tools Discussed/Used WIC Program: No   Consult Status Consult Status: PRN    Dyann KiefMarsha D Monti Jilek 11/02/2017, 3:06 PM

## 2017-11-07 ENCOUNTER — Telehealth: Payer: Self-pay

## 2017-11-07 NOTE — Telephone Encounter (Signed)
Additional information from [employer] filled out, signature obtained, and given to [place] for processing.

## 2017-11-16 ENCOUNTER — Ambulatory Visit: Payer: Self-pay

## 2017-11-16 NOTE — Lactation Note (Addendum)
This note was copied from a baby's chart. Lactation Consultation Note  Patient Name: Marissa Herman BJYNW'GToday's Date: 11/16/2017     Maternal Data [facility] due to baby having slow wt. Gain, indicating mom may have a low milk supply. Mom has also been supplementing 2xs a day up to 2oz and pumping to increase/maintain supply 2xs a day.   Feeding  Baby was measured for a pre/post wt. Started off at: 3892 and ended at 543938. Baby took in 26mL from the right breast and 46mL from mom's left breast with the assistance of nipple shields. Baby needed the shield on the rt side due to lack of coordination (remembering to keep tongue down..  baby was popping on and off the breast during feeds, taking in extra air; resulting in shorter nursing session=lower supply) Shield used on the left side due to mom having a shorter nipple.   LATCH Score  see flowsheet                 Interventions  Nipple shields  Lactation Tools Discussed/Used  Nipple shields, DEBP when needed, support pillows   Consult Status  LC determined that baby's volume of 82mL is more than adequate for a 20 min nursing session for a 2wk old baby. Mom is to continue feeding baby on demand and f/u with breastmilk or form. Supplement if baby doesn't have a good nursing session w/ audible swallows/output changes. Mom is going to ped for wt check on 2/26. F/u with LC as needed.     Burnadette PeterJaniya M Jahlon Baines 11/16/2017, 3:47 PM

## 2017-12-05 ENCOUNTER — Telehealth: Payer: Self-pay

## 2017-12-05 NOTE — Telephone Encounter (Signed)
Patient may return to work.

## 2017-12-05 NOTE — Telephone Encounter (Signed)
Pt Delivered 11/01/17 by Dr Jerene PitchSchuman. She is due to return to work next Tuesday, but her apt isn't until 3/25. Pt inquiring if she can get a work release note or if she needs to r/s her apt to prior to returning to work. ZO#[phone]#[phone]

## 2017-12-05 NOTE — Telephone Encounter (Signed)
Spoke w/pt. 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 States she is doing/feeling fine since delivery.

## 2017-12-10 NOTE — Telephone Encounter (Signed)
Pt calling again to ck on a note for work, is returning to work tomorrow and has 6w pp appt next Monday.  [phone]  Left detailed msg that note was ready to be picked up, to ask receptionist for it.

## 2017-12-17 ENCOUNTER — Ambulatory Visit (INDEPENDENT_AMBULATORY_CARE_PROVIDER_SITE_OTHER): Payer: BLUE CROSS/BLUE SHIELD | Admitting: Obstetrics and Gynecology

## 2017-12-17 ENCOUNTER — Encounter: Payer: Self-pay | Admitting: Obstetrics and Gynecology

## 2017-12-17 VITALS — BP 110/70 | HR 68 | Ht 66.0 in | Wt 185.0 lb

## 2017-12-17 DIAGNOSIS — Z30017 Encounter for initial prescription of implantable subdermal contraceptive: Secondary | ICD-10-CM | POA: Diagnosis not present

## 2017-12-17 MED ORDER — ETONOGESTREL 68 MG ~~LOC~~ IMPL: 68.0000 mg | DRUG_IMPLANT | Freq: Once | SUBCUTANEOUS | Status: DC

## 2017-12-17 NOTE — Progress Notes (Signed)
   OBSTETRICS POSTPARTUM CLINIC PROGRESS NOTE  Subjective:     Marissa Herman is a 27 y.o. 311P1001 female who presents for a postpartum visit. She is 6 weeks postpartum following a Term pregnancy and delivery by Vaginal, no problems at delivery.  I have fully reviewed the prenatal and intrapartum course. Anesthesia: epidural.  Postpartum course has been complicated by uncomplicated.  Baby is feeding by Breast.  Bleeding: patient has not  resumed menses.  Bowel function is normal. Bladder function is normal.  Patient is not sexually active. Contraception method desired is Nexplanon.  Postpartum depression screening: negative. Edinburgh 1.  The following portions of the patient's history were reviewed and updated as appropriate: allergies, current medications, past family history, past medical history, past social history, past surgical history and problem list.  Review of Systems A comprehensive review of systems was negative.  Objective:    BP 110/70   Pulse 68   Ht 5\' 6"  (1.676 m)   Wt 185 lb (83.9 kg)   Breastfeeding? Yes   BMI 29.86 kg/m   General:  alert and no distress   Breasts:  inspection negative, no nipple discharge or bleeding, no masses or nodularity palpable  Lungs: clear to auscultation bilaterally  Heart:  regular rate and rhythm, S1, S2 normal, no murmur, click, rub or gallop  Abdomen: soft, non-tender; bowel sounds normal; no masses,  no organomegaly.     Vulva:  normal, well healed second degree repair.   Vagina: normal vagina, no discharge, exudate, lesion, or erythema  Cervix:  no cervical motion tenderness and no lesions  Corpus: normal size, contour, position, consistency, mobility, non-tender  Adnexa:  normal adnexa and no mass, fullness, tenderness  Rectal Exam: Not performed.        Nexplanon Insertion  Patient given informed consent, signed copy in the chart, time out was performed. Appropriate time out taken.  Patient's left arm was prepped and  draped in the usual sterile fashion.. The ruler used to measure and mark insertion area.  Pt was prepped with betadine swab and then injected with 3 cc of 2% lidocaine with epinephrine. Nexplanon removed form packaging,  Device confirmed in needle, then inserted full length of needle and withdrawn per handbook instructions.  Pt insertion site covered with steri-strip and a bandage.   Minimal blood loss.  Pt tolerated the procedure well.    Assessment:  Post Partum Care visit 1. Encounter for initial prescription of implantable subdermal contraceptive - etonogestrel (NEXPLANON) implant 68 mg  2. Postpartum care and examination Patient doing well, no problems  3. Vaginal delivery    Plan:  See orders and Patient Instructions Resume all normal activities Follow up in: 4 week or as needed.   Marissa Idlerhristanna Virdie Penning MD [facility], [facility] 12/17/2017  10:13 AM

## 2018-01-28 ENCOUNTER — Ambulatory Visit: Payer: BLUE CROSS/BLUE SHIELD | Admitting: Obstetrics and Gynecology

## 2018-05-10 ENCOUNTER — Ambulatory Visit (INDEPENDENT_AMBULATORY_CARE_PROVIDER_SITE_OTHER): Payer: 59 | Admitting: Obstetrics & Gynecology

## 2018-05-10 ENCOUNTER — Encounter: Payer: Self-pay | Admitting: Obstetrics & Gynecology

## 2018-05-10 ENCOUNTER — Other Ambulatory Visit (HOSPITAL_COMMUNITY)
Admission: RE | Admit: 2018-05-10 | Discharge: 2018-05-10 | Disposition: A | Discharge status: Home / Self Care | Payer: Commercial Managed Care - PPO | Source: Ambulatory Visit | Attending: Obstetrics & Gynecology | Admitting: Obstetrics & Gynecology

## 2018-05-10 VITALS — BP 102/60 | Ht 66.0 in | Wt 176.0 lb

## 2018-05-10 DIAGNOSIS — Z01419 Encounter for gynecological examination (general) (routine) without abnormal findings: Secondary | ICD-10-CM

## 2018-05-10 DIAGNOSIS — Z124 Encounter for screening for malignant neoplasm of cervix: Secondary | ICD-10-CM | POA: Insufficient documentation

## 2018-05-10 DIAGNOSIS — Z0000 Encounter for general adult medical examination without abnormal findings: Secondary | ICD-10-CM

## 2018-05-10 NOTE — Patient Instructions (Signed)
PAP every year Labs next year

## 2018-05-10 NOTE — Progress Notes (Signed)
HPI:      Marissa Herman is a 27 y.o. G1P1001 who LMP was No LMP recorded. Patient has had an implant., she presents today for her annual examination. The patient has no complaints today. The patient is sexually active. Her last pap: approximate date 2017 and was normal. The patient does perform self breast exams.  There is no notable family history of breast or ovarian cancer in her family.  The patient has regular exercise: yes.  The patient denies current symptoms of depression.  No period w breast feeding and Nexplanon  GYN History: Contraception: Nexplanon  PMHx: Past Medical History:  Diagnosis Date  . History of abnormal cervical Pap smear 09/10/2016   LGSIL/ biopsy +HPV changes   Past Surgical History:  Procedure Laterality Date  . COLPOSCOPY  11/10/2016   [facility] LSIL  . WISDOM TOOTH EXTRACTION     Family History  Problem Relation Age of Onset  . Diabetes Father   . Heart disease Maternal Grandmother   . Hypertension Maternal Grandmother   . Stroke Maternal Grandmother   . Heart disease Paternal Grandfather        Had heart attack   Social History   Tobacco Use  . Smoking status: Former Smoker    Types: Cigarettes  . Smokeless tobacco: Never Used  . Tobacco comment: 1 pack/week of cigarettes. Quit March 2018  Substance Use Topics  . Alcohol use: No    Comment: former  . Drug use: No    Current Outpatient Medications:  .  famotidine (PEPCID AC) 10 MG chewable tablet, Chew 10 mg by mouth 2 (two) times daily., Disp: , Rfl:  .  ferrous sulfate (FERROUSUL) 325 (65 FE) MG tablet, Take 1 tablet (325 mg total) by mouth 2 (two) times daily. (Patient not taking: Reported on 12/17/2017), Disp: 60 tablet, Rfl: 1 .  ibuprofen (ADVIL,MOTRIN) 600 MG tablet, Take 1 tablet (600 mg total) by mouth every 6 (six) hours. (Patient not taking: Reported on 05/10/2018), Disp: 30 tablet, Rfl: 0 .  Melatonin 1 MG CAPS, Take by mouth., Disp: , Rfl:  .  pantoprazole (PROTONIX) 40 MG  tablet, Take 1 tablet (40 mg total) by mouth daily. (Patient not taking: Reported on 12/17/2017), Disp: 60 tablet, Rfl: 1 .  Prenatal MV-Min-FA-Omega-3 (PRENATAL GUMMIES/DHA & FA PO), Take 1 tablet by mouth., Disp: , Rfl:   Current Facility-Administered Medications:  .  etonogestrel (NEXPLANON) implant 68 mg, 68 mg, Subdermal, Once, Schuman, Christanna R, MD Allergies: Other  Review of Systems  Constitutional: Negative for chills, fever and malaise/fatigue.  HENT: Negative for congestion, sinus pain and sore throat.   Eyes: Negative for blurred vision and pain.  Respiratory: Negative for cough and wheezing.   Cardiovascular: Negative for chest pain and leg swelling.  Gastrointestinal: Negative for abdominal pain, constipation, diarrhea, heartburn, nausea and vomiting.  Genitourinary: Negative for dysuria, frequency, hematuria and urgency.  Musculoskeletal: Negative for back pain, joint pain, myalgias and neck pain.  Skin: Negative for itching and rash.  Neurological: Negative for dizziness, tremors and weakness.  Endo/Heme/Allergies: Does not bruise/bleed easily.  Psychiatric/Behavioral: Negative for depression. The patient is not nervous/anxious and does not have insomnia.     Objective: BP 102/60   Ht 5\' 6"  (1.676 m)   Wt 176 lb (79.8 kg)   BMI 28.41 kg/m   Filed Weights   05/10/18 0910  Weight: 176 lb (79.8 kg)   Body mass index is 28.41 kg/m. Physical Exam  Constitutional: She is  oriented to person, place, and time. She appears well-developed and well-nourished. No distress.  Genitourinary: Rectum normal, vagina normal and uterus normal. Pelvic exam was performed with patient supine. There is no rash or lesion on the right labia. There is no rash or lesion on the left labia. Vagina exhibits no lesion. No bleeding in the vagina. Right adnexum does not display mass and does not display tenderness. Left adnexum does not display mass and does not display tenderness. Cervix does not  exhibit motion tenderness, lesion, friability or polyp.   Uterus is mobile and midaxial. Uterus is not enlarged or exhibiting a mass.  HENT:  Head: Normocephalic and atraumatic. Head is without laceration.  Right Ear: Hearing normal.  Left Ear: Hearing normal.  Nose: No epistaxis.  No foreign bodies.  Mouth/Throat: Uvula is midline, oropharynx is clear and moist and mucous membranes are normal.  Eyes: Pupils are equal, round, and reactive to light.  Neck: Normal range of motion. Neck supple. No thyromegaly present.  Cardiovascular: Normal rate and regular rhythm. Exam reveals no gallop and no friction rub.  No murmur heard. Pulmonary/Chest: Effort normal and breath sounds normal. No respiratory distress. She has no wheezes. Right breast exhibits no mass, no skin change and no tenderness. Left breast exhibits no mass, no skin change and no tenderness.  Abdominal: Soft. Bowel sounds are normal. She exhibits no distension. There is no tenderness. There is no rebound.  Musculoskeletal: Normal range of motion.  Neurological: She is alert and oriented to person, place, and time. No cranial nerve deficit.  Skin: Skin is warm and dry.  Psychiatric: She has a normal mood and affect. Judgment normal.  Vitals reviewed.   Assessment:  ANNUAL EXAM 1. Annual physical exam   2. Screening for cervical cancer    Screening Plan:            1.  Cervical Screening-  Pap smear done today  2. Breast screening- Exam annually   3. Flu shot declined  4. Labs due next year  5. Counseling for contraception: Nexplanon    F/U  Return in about 1 year (around 05/11/2019) for Annual.  Annamarie MajorPaul Ociel Retherford, MD, Merlinda FrederickFACOG [facility], [facility] 05/10/2018  9:28 AM

## 2018-05-13 LAB — CYTOLOGY - PAP: Diagnosis: NEGATIVE

## 2018-12-20 ENCOUNTER — Other Ambulatory Visit: Payer: Self-pay

## 2018-12-20 ENCOUNTER — Encounter: Payer: Self-pay | Admitting: Obstetrics & Gynecology

## 2018-12-20 ENCOUNTER — Ambulatory Visit (INDEPENDENT_AMBULATORY_CARE_PROVIDER_SITE_OTHER): Payer: BC Managed Care – PPO | Admitting: Obstetrics & Gynecology

## 2018-12-20 DIAGNOSIS — N921 Excessive and frequent menstruation with irregular cycle: Secondary | ICD-10-CM | POA: Diagnosis not present

## 2018-12-20 MED ORDER — NORETHIN ACE-ETH ESTRAD-FE 1-20 MG-MCG PO TABS: 1.0000 | ORAL_TABLET | Freq: Every day | ORAL | 1 refills | Status: DC

## 2018-12-20 NOTE — Progress Notes (Signed)
Virtual Visit via Telephone Note  I connected with Marissa Herman on 12/20/18 at  2:30 PM EDT by telephone and verified that I am speaking with the correct person using two identifiers.   I discussed the limitations, risks, security and privacy concerns of performing an evaluation and management service by telephone and the availability of in person appointments. I also discussed with the patient that there may be a patient responsible charge related to this service. The patient expressed understanding and agreed to proceed.  She was at home and I was in my office.  History of Present Illness:   Ms. Marissa Herman is a 28 y.o. G1P1001 who LMP was No LMP recorded. Patient has had an implant., presents today for a problem visit.  She complains of metrorrhagia (throughout cycle) that  began in Dec after she stopped breast feeding then and its severity is described as moderate.  She has periods that are now every two weeks most of the time, sometimes 3 weeks; she had none while also breast feeding; she has had Nexplanon placed post partum since 11/2017;  and they are associated with mild menstrual cramping.  She has used the following for attempts at control: maxi pad.  Previous evaluation: none. Prior Diagnosis: no prior h/o fibroids, polyps, PCOS, cysts. Previous Treatment: none.  She is single partner, contraception - Nexplanon.  Hx of STDs: none. She is premenopausal.  She  has a past medical history of History of abnormal cervical Pap smear (09/10/2016). Also,  has a past surgical history that includes Colposcopy (11/10/2016) and Wisdom tooth extraction., family history includes Diabetes in her father; Heart disease in her maternal grandmother and paternal grandfather; Hypertension in her maternal grandmother; Stroke in her maternal grandmother.,  reports that she has quit smoking. Her smoking use included cigarettes. She has never used smokeless tobacco. She reports that she does not drink alcohol  or use drugs.  Current Outpatient Medications:  Marland Kitchen  Melatonin 1 MG CAPS, Take by mouth., Disp: , Rfl:  .  Prenatal MV-Min-FA-Omega-3 (PRENATAL GUMMIES/DHA & FA PO), Take 1 tablet by mouth., Disp: , Rfl:   Current Facility-Administered Medications:  .  etonogestrel (NEXPLANON) implant 68 mg, 68 mg, Subdermal   Also, is allergic to Nitrile gloves  Review of Systems  Constitutional: Negative for chills, fever and malaise/fatigue.  HENT: Negative for congestion, sinus pain and sore throat.   Eyes: Negative for blurred vision and pain.  Respiratory: Negative for cough and wheezing.   Cardiovascular: Negative for chest pain and leg swelling.  Gastrointestinal: Negative for abdominal pain, constipation, diarrhea, heartburn, nausea and vomiting.  Genitourinary: Negative for dysuria, frequency, hematuria and urgency.  Musculoskeletal: Negative for back pain, joint pain, myalgias and neck pain.  Skin: Negative for itching and rash.  Neurological: Negative for dizziness, tremors and weakness.  Endo/Heme/Allergies: Does not bruise/bleed easily.  Psychiatric/Behavioral: Negative for depression. The patient is not nervous/anxious and does not have insomnia.    Observations/Objective: No exam today, due to telephone eVisit due to [facility] virus restriction on elective visits and procedures.  Prior visits reviewed along with ultrasounds/labs as indicated.  Assessment and Plan: 1. Metrorrhagia Patient's irregular menses are most likely of an anovulatory or hormonal etiology.  Differential diagnoses include PCOS, thyroid dysfunction,  elevated prolactin dysfunction, stress, significant weight loss, exercise or anything can disturb the hypothalamic-pituitary-ovarian axis.  Likely hormonal, based on post partum breast feeding then its cessation, and Nexplanon.  Option for removal vs overlap w OCP for 1-2 mos discussed,  will Rx pill and see if will help normalize cycle to monthly or better.  Patient was told  to use a menstrual calendar for record keeping.  Will follow up results and manage accordingly  Follow Up Instructions: As needed after treatment plan above   I discussed the assessment and treatment plan with the patient. The patient was provided an opportunity to ask questions and all were answered. The patient agreed with the plan and demonstrated an understanding of the instructions.   The patient was advised to call back or seek an in-person evaluation if the symptoms worsen or if the condition fails to improve as anticipated.  I provided 14 minutes of non-face-to-face time during this encounter.   Letitia Libra, MD [facility], [facility] 12/20/2018  3:01 PM

## 2019-02-20 ENCOUNTER — Other Ambulatory Visit: Payer: Self-pay | Admitting: Obstetrics & Gynecology

## 2019-06-06 ENCOUNTER — Other Ambulatory Visit: Payer: Self-pay

## 2019-06-06 ENCOUNTER — Encounter: Payer: Self-pay | Admitting: Obstetrics & Gynecology

## 2019-06-06 ENCOUNTER — Ambulatory Visit (INDEPENDENT_AMBULATORY_CARE_PROVIDER_SITE_OTHER): Payer: BC Managed Care – PPO | Admitting: Obstetrics & Gynecology

## 2019-06-06 VITALS — BP 100/60 | Ht 65.5 in | Wt 163.0 lb

## 2019-06-06 DIAGNOSIS — Z3049 Encounter for surveillance of other contraceptives: Secondary | ICD-10-CM

## 2019-06-06 DIAGNOSIS — Z23 Encounter for immunization: Secondary | ICD-10-CM | POA: Diagnosis not present

## 2019-06-06 DIAGNOSIS — Z3046 Encounter for surveillance of implantable subdermal contraceptive: Secondary | ICD-10-CM

## 2019-06-06 DIAGNOSIS — G43809 Other migraine, not intractable, without status migrainosus: Secondary | ICD-10-CM

## 2019-06-06 DIAGNOSIS — N920 Excessive and frequent menstruation with regular cycle: Secondary | ICD-10-CM

## 2019-06-06 NOTE — Progress Notes (Signed)
  Nexplanon removal Procedure note - The Nexplanon was noted in the patient's arm and the end was identified. The skin was cleansed with a Betadine solution. A small injection of subcutaneous lidocaine with epinephrine was given over the end of the implant. An incision was made at the end of the implant. The rod was noted in the incision and grasped with a hemostat. It was noted to be intact.  Steri-Strip was placed approximating the incision. Hemostasis was noted.  Pt plans to either try for pregnancy or to revert to non-hormonal method due to how hormones affect her migraine headaches.  Also, periods persisted on Nexplanon and were heavy.  Consider Paraguard.  Progesterone pills, with their decreased efficacy but low risk for migraine exacerbation, discussed.  Hoyt Koch ,MD 06/06/2019,11:30 AM

## 2019-06-06 NOTE — Patient Instructions (Signed)
Nexplanon Instructions After Insertion  Keep bandage clean and dry for 24 hours  May use ice/Tylenol/Ibuprofen for soreness or pain  If you develop fever, drainage or increased warmth from incision site-contact office immediately   

## 2019-09-26 NOTE — L&D Delivery Note (Addendum)
Patient difficult to perform cervical exam as she is a difficult check due to vigorous movement. No obvious changes since last cytotec administration

## 2019-09-26 NOTE — L&D Delivery Note (Signed)
Vaginal Delivery Note  Spontaneous delivery of live viable female infant from the LOA position through an intact perineum. Delivery of anterior right shoulder with gentle downward guidance followed by delivery of the left posterior shoulder with gentle upward guidance. Body followed spontaneously. Infant placed on maternal chest. Nursery present and helped with neonatal resuscitation and evaluation. Cord clamped and cut after one minute. Cord blood collected. Placenta delivered spontaneously and intact with a 3 vessel cord.  2nd degree perineal, right periurethral, and cervical laceration. Patient had brisk red bleeding. Given pitocin, methergine and cytotec. Will take to the OR for exam under anesthesia and repair of lacerations. Uterus firm and below umbilicus at the end of the delivery.  Mom and baby recovering in stable condition. Sponge and needle counts were correct at the end of the delivery.  APGARS: 1 minute:9    5 minutes: 10 Weight: 7lbs 13 oz Epidural present  Marissa Idler MD [facility], [facility] 08/05/20 7:11 PM

## 2019-09-26 NOTE — L&D Delivery Note (Addendum)
Patient placed on continuous fetal HR monitor to non tender area of abdomen. M. Eunice Blase, CNM notified by phone that patient has arrived and was told to place cytotec as ordered. Eunice Blase, CNM stated she would be by in a few hours in time to place next dose of cytotec.

## 2019-09-26 NOTE — L&D Delivery Note (Addendum)
Patient arrived to unit ambulatory for scheduled Induction of labor. Patient is G2P1 100w0d. Patient is up changing into hospital gown. Consent for treatment, HIPPA signed. Oriented to hospital room and policies regarding visitors. Patient stated she was tested for covid on Saturday on 07/31/2020 at [facility] but has no records on hand. Patient notified that she will need covid test performed on site. Will continue to admit patient as orders are present.

## 2019-12-17 ENCOUNTER — Other Ambulatory Visit: Payer: Self-pay

## 2019-12-17 ENCOUNTER — Other Ambulatory Visit (HOSPITAL_COMMUNITY)
Admission: RE | Admit: 2019-12-17 | Discharge: 2019-12-17 | Disposition: A | Discharge status: Home / Self Care | Payer: Commercial Managed Care - PPO | Source: Ambulatory Visit | Attending: Advanced Practice Midwife | Admitting: Advanced Practice Midwife

## 2019-12-17 ENCOUNTER — Encounter: Payer: Self-pay | Admitting: Advanced Practice Midwife

## 2019-12-17 ENCOUNTER — Ambulatory Visit (INDEPENDENT_AMBULATORY_CARE_PROVIDER_SITE_OTHER): Payer: BC Managed Care – PPO | Admitting: Advanced Practice Midwife

## 2019-12-17 VITALS — BP 125/77 | HR 69 | Wt 165.0 lb

## 2019-12-17 DIAGNOSIS — Z3480 Encounter for supervision of other normal pregnancy, unspecified trimester: Secondary | ICD-10-CM

## 2019-12-17 DIAGNOSIS — Z113 Encounter for screening for infections with a predominantly sexual mode of transmission: Secondary | ICD-10-CM | POA: Insufficient documentation

## 2019-12-17 DIAGNOSIS — N912 Amenorrhea, unspecified: Secondary | ICD-10-CM

## 2019-12-17 DIAGNOSIS — Z3201 Encounter for pregnancy test, result positive: Secondary | ICD-10-CM | POA: Diagnosis not present

## 2019-12-17 DIAGNOSIS — Z3481 Encounter for supervision of other normal pregnancy, first trimester: Secondary | ICD-10-CM

## 2019-12-17 DIAGNOSIS — Z3A01 Less than 8 weeks gestation of pregnancy: Secondary | ICD-10-CM

## 2019-12-17 DIAGNOSIS — Z124 Encounter for screening for malignant neoplasm of cervix: Secondary | ICD-10-CM

## 2019-12-17 HISTORY — DX: Encounter for supervision of other normal pregnancy, unspecified trimester: Z34.80

## 2019-12-17 LAB — OB RESULTS CONSOLE VARICELLA ZOSTER ANTIBODY, IGG: Varicella: IMMUNE

## 2019-12-17 LAB — POCT URINE PREGNANCY: Preg Test, Ur: POSITIVE — AB

## 2019-12-17 NOTE — Patient Instructions (Signed)
Exercise During Pregnancy Exercise is an important part of being healthy for people of all ages. Exercise improves the function of your heart and lungs and helps you maintain strength, flexibility, and a healthy body weight. Exercise also boosts energy levels and elevates mood. Most women should exercise regularly during pregnancy. In rare cases, women with certain medical conditions or complications may be asked to limit or avoid exercise during pregnancy. How does this affect me? Along with maintaining general strength and flexibility, exercising during pregnancy can help:  Keep strength in muscles that are used during labor and childbirth.  Decrease low back pain.  Reduce symptoms of depression.  Control weight gain during pregnancy.  Reduce the risk of needing insulin if you develop diabetes during pregnancy.  Decrease the risk of cesarean delivery.  Speed up your recovery after giving birth. How does this affect my baby? Exercise can help you have a healthy pregnancy. Exercise does not cause premature birth. It will not cause your baby to weigh less at birth. What exercises can I do? Many exercises are safe for you to do during pregnancy. Do a variety of exercises that safely increase your heart and breathing rates and help you build and maintain muscle strength. Do exercises exactly as told by your health care provider. You may do these exercises:  Walking or hiking.  Swimming.  Water aerobics.  Riding a stationary bike.  Strength training.  Modified yoga or Pilates. Tell your instructor that you are pregnant. Avoid overstretching, and avoid lying on your back for long periods of time.  Running or jogging. Only choose this type of exercise if you: ? Ran or jogged regularly before your pregnancy. ? Can run or jog and still talk in complete sentences. What exercises should I avoid? Depending on your level of fitness and whether you exercised regularly before your  pregnancy, you may be told to limit high-intensity exercise. You can tell that you are exercising at a high intensity if you are breathing much harder and faster and cannot hold a conversation while exercising. You must avoid:  Contact sports.  Activities that put you at risk for falling on or being hit in the belly, such as downhill skiing, water skiing, surfing, rock climbing, cycling, gymnastics, and horseback riding.  Scuba diving.  Skydiving.  Yoga or Pilates in a room that is heated to high temperatures.  Jogging or running, unless you ran or jogged regularly before your pregnancy. While jogging or running, you should always be able to talk in full sentences. Do not run or jog so fast that you are unable to have a conversation.  Do not exercise at more than 6,000 feet above sea level (high elevation) if you are not used to exercising at high elevation. How do I exercise in a safe way?   Avoid overheating. Do not exercise in very high temperatures.  Wear loose-fitting, breathable clothes.  Avoid dehydration. Drink enough water before, during, and after exercise to keep your urine pale yellow.  Avoid overstretching. Because of hormone changes during pregnancy, it is easy to overstretch muscles, tendons, and ligaments during pregnancy.  Start slowly and ask your health care provider to recommend the types of exercise that are safe for you.  Do not exercise to lose weight. Follow these instructions at home:  Exercise on most days or all days of the week. Try to exercise for 30 minutes a day, 5 days a week, unless your health care provider tells you not to.  If   you actively exercised before your pregnancy and you are healthy, your health care provider may tell you to continue to do moderate to high-intensity exercise.  If you are just starting to exercise or did not exercise much before your pregnancy, your health care provider may tell you to do low to moderate-intensity  exercise. Questions to ask your health care provider  Is exercise safe for me?  What are signs that I should stop exercising?  Does my health condition mean that I should not exercise during pregnancy?  When should I avoid exercising during pregnancy? Stop exercising and contact a health care provider if: You have any unusual symptoms, such as:  Mild contractions of the uterus or cramps in the abdomen.  Dizziness that does not go away when you rest. Stop exercising and get help right away if: You have any unusual symptoms, such as:  Sudden, severe pain in your low back or your belly.  Mild contractions of the uterus or cramps in the abdomen that do not improve with rest and drinking fluids.  Chest pain.  Bleeding or fluid leaking from your vagina.  Shortness of breath. These symptoms may represent a serious problem that is an emergency. Do not wait to see if the symptoms will go away. Get medical help right away. Call your local emergency services (911 in the [place] Do not drive yourself to the hospital. Summary  Most women should exercise regularly throughout pregnancy. In rare cases, women with certain medical conditions or complications may be asked to limit or avoid exercise during pregnancy.  Do not exercise to lose weight during pregnancy.  Your health care provider will tell you what level of physical activity is right for you.  Stop exercising and contact a health care provider if you have mild contractions of the uterus or cramps in the abdomen. Get help right away if these contractions or cramps do not improve with rest and drinking fluids.  Stop exercising and get help right away if you have sudden, severe pain in your low back or belly, chest pain, shortness of breath, or bleeding or leaking of fluid from your vagina. This information is not intended to replace advice given to you by your health care provider. Make sure you discuss any questions you have with your  health care provider. Document Revised: 01/02/2019 Document Reviewed: 10/16/2018 Elsevier Patient Education  2020 [employer] Eating Plan for Pregnant Women While you are pregnant, your body requires additional nutrition to help support your growing baby. You also have a higher need for some vitamins and minerals, such as folic acid, calcium, iron, and vitamin D. Eating a healthy, well-balanced diet is very important for your health and your baby's health. Your need for extra calories varies for the three 3-month segments of your pregnancy (trimesters). For most women, it is recommended to consume:  150 extra calories a day during the first trimester.  300 extra calories a day during the second trimester.  300 extra calories a day during the third trimester. What are tips for following this plan?   Do not try to lose weight or go on a diet during pregnancy.  Limit your overall intake of foods that have "empty calories." These are foods that have little nutritional value, such as sweets, desserts, candies, and sugar-sweetened beverages.  Eat a variety of foods (especially fruits and vegetables) to get a full range of vitamins and minerals.  Take a prenatal vitamin to help meet your additional vitamin and mineral needs   during pregnancy, specifically for folic acid, iron, calcium, and vitamin D.  Remember to stay active. Ask your health care provider what types of exercise and activities are safe for you.  Practice good food safety and cleanliness. Wash your hands before you eat and after you prepare raw meat. Wash all fruits and vegetables well before peeling or eating. Taking these actions can help to prevent food-borne illnesses that can be very dangerous to your baby, such as listeriosis. Ask your health care provider for more information about listeriosis. What does 150 extra calories look like? Healthy options that provide 150 extra calories each day could be any of the  following:  6-8 oz (170-230 g) of plain low-fat yogurt with  cup of berries.  1 apple with 2 teaspoons (11 g) of peanut butter.  Cut-up vegetables with  cup (60 g) of hummus.  8 oz (230 mL) or 1 cup of low-fat chocolate milk.  1 stick of string cheese with 1 medium orange.  1 peanut butter and jelly sandwich that is made with one slice of whole-wheat bread and 1 tsp (5 g) of peanut butter. For 300 extra calories, you could eat two of those healthy options each day. What is a healthy amount of weight to gain? The right amount of weight gain for you is based on your BMI before you became pregnant. If your BMI:  Was less than 18 (underweight), you should gain 28-40 lb (13-18 kg).  Was 18-24.9 (normal), you should gain 25-35 lb (11-16 kg).  Was 25-29.9 (overweight), you should gain 15-25 lb (7-11 kg).  Was 30 or greater (obese), you should gain 11-20 lb (5-9 kg). What if I am having twins or multiples? Generally, if you are carrying twins or multiples:  You may need to eat 300-600 extra calories a day.  The recommended range for total weight gain is 25-54 lb (11-25 kg), depending on your BMI before pregnancy.  Talk with your health care provider to find out about nutritional needs, weight gain, and exercise that is right for you. What foods can I eat?  Fruits All fruits. Eat a variety of colors and types of fruit. Remember to wash your fruits well before peeling or eating. Vegetables All vegetables. Eat a variety of colors and types of vegetables. Remember to wash your vegetables well before peeling or eating. Grains All grains. Choose whole grains, such as whole-wheat bread, oatmeal, or brown rice. Meats and other protein foods Lean meats, including chicken, turkey, fish, and lean cuts of beef, veal, or pork. If you eat fish or seafood, choose options that are higher in omega-3 fatty acids and lower in mercury, such as salmon, herring, mussels, trout, sardines, pollock,  shrimp, crab, and lobster. Tofu. Tempeh. Beans. Eggs. Peanut butter and other nut butters. Make sure that all meats, poultry, and eggs are cooked to food-safe temperatures or "well-done." Two or more servings of fish are recommended each week in order to get the most benefits from omega-3 fatty acids that are found in seafood. Choose fish that are lower in mercury. You can find more information online:  www.fda.gov Dairy Pasteurized milk and milk alternatives (such as almond milk). Pasteurized yogurt and pasteurized cheese. Cottage cheese. Sour cream. Beverages Water. Juices that contain 100% fruit juice or vegetable juice. Caffeine-free teas and decaffeinated coffee. Drinks that contain caffeine are okay to drink, but it is better to avoid caffeine. Keep your total caffeine intake to less than 200 mg each day (which is 12 oz   or 355 mL of coffee, tea, or soda) or the limit as told by your health care provider. Fats and oils Fats and oils are okay to include in moderation. Sweets and desserts Sweets and desserts are okay to include in moderation. Seasoning and other foods All pasteurized condiments. The items listed above may not be a complete list of foods and beverages you can eat. Contact a dietitian for more information. What foods are not recommended? Fruits Unpasteurized fruit juices. Vegetables Raw (unpasteurized) vegetable juices. Meats and other protein foods Lunch meats, bologna, hot dogs, or other deli meats. (If you must eat those meats, reheat them until they are steaming hot.) Refrigerated pat, meat spreads from a meat counter, smoked seafood that is found in the refrigerated section of a store. Raw or undercooked meats, poultry, and eggs. Raw fish, such as sushi or sashimi. Fish that have high mercury content, such as tilefish, shark, swordfish, and king mackerel. To learn more about mercury in fish, talk with your health care provider or look for online resources, such  as:  www.fda.gov Dairy Raw (unpasteurized) milk and any foods that have raw milk in them. Soft cheeses, such as feta, queso blanco, queso fresco, Brie, Camembert cheeses, blue-veined cheeses, and Panela cheese (unless it is made with pasteurized milk, which must be stated on the label). Beverages Alcohol. Sugar-sweetened beverages, such as sodas, teas, or energy drinks. Seasoning and other foods Homemade fermented foods and drinks, such as pickles, sauerkraut, or kombucha drinks. (Store-bought pasteurized versions of these are okay.) Salads that are made in a store or deli, such as ham salad, chicken salad, egg salad, tuna salad, and seafood salad. The items listed above may not be a complete list of foods and beverages you should avoid. Contact a dietitian for more information. Where to find more information To calculate the number of calories you need based on your height, weight, and activity level, you can use an online calculator such as:  www.choosemyplate.gov/MyPlatePlan To calculate how much weight you should gain during pregnancy, you can use an online pregnancy weight gain calculator such as:  www.choosemyplate.gov/pregnancy-weight-gain-calculator Summary  While you are pregnant, your body requires additional nutrition to help support your growing baby.  Eat a variety of foods, especially fruits and vegetables to get a full range of vitamins and minerals.  Practice good food safety and cleanliness. Wash your hands before you eat and after you prepare raw meat. Wash all fruits and vegetables well before peeling or eating. Taking these actions can help to prevent food-borne illnesses, such as listeriosis, that can be very dangerous to your baby.  Do not eat raw meat or fish. Do not eat fish that have high mercury content, such as tilefish, shark, swordfish, and king mackerel. Do not eat unpasteurized (raw) dairy.  Take a prenatal vitamin to help meet your additional vitamin and  mineral needs during pregnancy, specifically for folic acid, iron, calcium, and vitamin D. This information is not intended to replace advice given to you by your health care provider. Make sure you discuss any questions you have with your health care provider. Document Revised: 01/30/2019 Document Reviewed: 06/08/2017 Elsevier Patient Education  2020 Else[employer] Inc. Prenatal Care Prenatal care is health care during pregnancy. It helps you and your unborn baby (fetus) stay as healthy as possible. Prenatal care may be provided by a midwife, a family practice health care provider, or a childbirth and pregnancy specialist (obstetrician). How does this affect me? During pregnancy, you will be closely monitored   for any new conditions that might develop. To lower your risk of pregnancy complications, you and your health care provider will talk about any underlying conditions you have. How does this affect my baby? Early and consistent prenatal care increases the chance that your baby will be healthy during pregnancy. Prenatal care lowers the risk that your baby will be:  Born early (prematurely).  Smaller than expected at birth (small for gestational age). What can I expect at the first prenatal care visit? Your first prenatal care visit will likely be the longest. You should schedule your first prenatal care visit as soon as you know that you are pregnant. Your first visit is a good time to talk about any questions or concerns you have about pregnancy. At your visit, you and your health care provider will talk about:  Your medical history, including: ? Any past pregnancies. ? Your family's medical history. ? The baby's father's medical history. ? Any long-term (chronic) health conditions you have and how you manage them. ? Any surgeries or procedures you have had. ? Any current over-the-counter or prescription medicines, herbs, or supplements you are taking.  Other factors that could pose a risk  to your baby, including:  Your home setting and your stress levels, including: ? Exposure to abuse or violence. ? Household financial strain. ? Mental health conditions you have.  Your daily health habits, including diet and exercise. Your health care provider will also:  Measure your weight, height, and blood pressure.  Do a physical exam, including a pelvic and breast exam.  Perform blood tests and urine tests to check for: ? Urinary tract infection. ? Sexually transmitted infections (STIs). ? Low iron levels in your blood (anemia). ? Blood type and certain proteins on red blood cells (Rh antibodies). ? Infections and immunity to viruses, such as hepatitis B and rubella. ? HIV (human immunodeficiency virus).  Do an ultrasound to confirm your baby's growth and development and to help predict your estimated due date (EDD). This ultrasound is done with a probe that is inserted into the vagina (transvaginal ultrasound).  Discuss your options for genetic screening.  Give you information about how to keep yourself and your baby healthy, including: ? Nutrition and taking vitamins. ? Physical activity. ? How to manage pregnancy symptoms such as nausea and vomiting (morning sickness). ? Infections and substances that may be harmful to your baby and how to avoid them. ? Food safety. ? Dental care. ? Working. ? Travel. ? Warning signs to watch for and when to call your health care provider. How often will I have prenatal care visits? After your first prenatal care visit, you will have regular visits throughout your pregnancy. The visit schedule is often as follows:  Up to week 28 of pregnancy: once every 4 weeks.  28-36 weeks: once every 2 weeks.  After 36 weeks: every week until delivery. Some women may have visits more or less often depending on any underlying health conditions and the health of the baby. Keep all follow-up and prenatal care visits as told by your health care  provider. This is important. What happens during routine prenatal care visits? Your health care provider will:  Measure your weight and blood pressure.  Check for fetal heart sounds.  Measure the height of your uterus in your abdomen (fundal height). This may be measured starting around week 20 of pregnancy.  Check the position of your baby inside your uterus.  Ask questions about your diet, sleeping patterns, and   whether you can feel the baby move.  Review warning signs to watch for and signs of labor.  Ask about any pregnancy symptoms you are having and how you are dealing with them. Symptoms may include: ? Headaches. ? Nausea and vomiting. ? Vaginal discharge. ? Swelling. ? Fatigue. ? Constipation. ? Any discomfort, including back or pelvic pain. Make a list of questions to ask your health care provider at your routine visits. What tests might I have during prenatal care visits? You may have blood, urine, and imaging tests throughout your pregnancy, such as:  Urine tests to check for glucose, protein, or signs of infection.  Glucose tests to check for a form of diabetes that can develop during pregnancy (gestational diabetes mellitus). This is usually done around week 24 of pregnancy.  An ultrasound to check your baby's growth and development and to check for birth defects. This is usually done around week 20 of pregnancy.  A test to check for group B strep (GBS) infection. This is usually done around week 36 of pregnancy.  Genetic testing. This may include blood or imaging tests, such as an ultrasound. Some genetic tests are done during the first trimester and some are done during the second trimester. What else can I expect during prenatal care visits? Your health care provider may recommend getting certain vaccines during pregnancy. These may include:  A yearly flu shot (annual influenza vaccine). This is especially important if you will be pregnant during flu  season.  Tdap (tetanus, diphtheria, pertussis) vaccine. Getting this vaccine during pregnancy can protect your baby from whooping cough (pertussis) after birth. This vaccine may be recommended between weeks 27 and 36 of pregnancy. Later in your pregnancy, your health care provider may give you information about:  Childbirth and breastfeeding classes.  Choosing a health care provider for your baby.  Umbilical cord banking.  Breastfeeding.  Birth control after your baby is born.  The hospital labor and delivery unit and how to tour it.  Registering at the hospital before you go into labor. Where to find more information  Office on Women's Health: womenshealth.gov  American Pregnancy Association: americanpregnancy.org  March of Dimes: marchofdimes.org Summary  Prenatal care helps you and your baby stay as healthy as possible during pregnancy.  Your first prenatal care visit will most likely be the longest.  You will have visits and tests throughout your pregnancy to monitor your health and your baby's health.  Bring a list of questions to your visits to ask your health care provider.  Make sure to keep all follow-up and prenatal care visits with your health care provider. This information is not intended to replace advice given to you by your health care provider. Make sure you discuss any questions you have with your health care provider. Document Revised: 01/01/2019 Document Reviewed: 09/10/2017 Elsevier Patient Education  2020 Elsevier Inc.     COVID-19 and Your Pregnancy FAQ  How can I prevent infection with COVID-19 during my pregnancy? Social distancing is key. Please limit any interactions in public. Try and work from home if possible. Frequently wash your hands after touching possibly contaminated surfaces. Avoid touching your face.  Minimize trips to the store. Consider online ordering when possible.   Should I wear a mask? YES. It is recommended by the CDC  that all people wear a cloth mask or facial covering in public. You should wear a mask to your visits in the office. This will help reduce transmission as well as your   risk or acquiring COVID-19. New studies are showing that even asymptomatic individuals can spread the virus from talking.   Where can I get a mask? [place] and the city of [place] are partnering to provide masks to community members. You can pick up a mask from several locations. This website also has instructions about how to make a mask by sewing or without sewing by using a t-shirt or bandana.  https://www.[place].gov/i-want-to/learn-about/covid-19-information-and-updates/covid-19-face-mask-project  Studies have shown that if you were a tube or nylon stocking from pantyhose over a cloth mask it makes the cloth mask almost as effective as a N95 mask.  https://www.npr.org/sections/goatsandsoda/2019/01/15/840146830/adding-a-nylon-stocking-layer-could-boost-protection-from-cloth-masks-study-find  What are the symptoms of COVID-19? Fever (greater than 100.4 F), dry cough, shortness of breath.  Am I more at risk for COVID-19 since I am pregnant? There is not currently data showing that pregnant women are more adversely impacted by COVID-19 than the general population. However, we know that pregnant women tend to have worse respiratory complications from similar diseases such as the flu and SARS and for this reason should be considered an at-risk population.  What do I do if I am experiencing the symptoms of COVID-19? Testing is being limited because of test availability. If you are experiencing symptoms you should quarantine yourself, and the members of your family, for at least 2 weeks at home.   Please visit this website for more information: https://www.cdc.gov/coronavirus/2019-ncov/if-you-are-sick/steps-when-sick.html  When should I go to the Emergency Room? Please go to the emergency room if you are experiencing  ANY of these symptoms*:  1.    Difficulty breathing or shortness of breath 2.    Persistent pain or pressure in the chest 3.    Confusion or difficulty being aroused (or awakened) 4.    Bluish lips or face  *This list is not all inclusive. Please consult our office for any other symptoms that are severe or concerning.  What do I do if I am having difficulty breathing? You should go to the Emergency Room for evaluation. At this time they have a tent set up for evaluating patients with COVID-19 symptoms.   How will my prenatal care be different because of the COVID-19 pandemic? It has been recommended to reduce the frequency of face-to-face visits and use resources such as telephone and virtual visits when possible. Using a scale, blood pressure machine and fetal doppler at home can further help reduce face-to-face visits. You will be provided with additional information on this topic.  We ask that you come to your visits alone to minimize potential exposures to  COVID-19.  How can I receive childbirth education? At this time in-person classes have been cancelled. You can register for online childbirth education, breastfeeding, and newborn care classes.  Please visit:  [email or web address] for more information  How will my hospital birth experience be different? The hospital is currently limiting visitors. This means that while you are in labor you can only have one person at the hospital with you. Additional family members will not be allowed to wait in the building or outside your room. Your one support person can be the father of the baby, a relative, a doula, or a friend. Once one support person is designated that person will wear a band. This band cannot be shared with multiple people.  Nitrous Gas is not being offered for pain relief since the tubing and filter for the machine can not be sanitized in a way to guarantee prevention of transmission of COVID-19.  Nasal cannula use of    oxygen for fetal indications has also been discontinued.  Currently a clear plastic sheet is being hung between mom and the delivering provider during pushing and delivery to help prevent transmission of COVID-19.      How long will I stay in the hospital for after giving birth? It is also recommended that discharge home be expedited during the COVID-19 outbreak. This means staying for 1 day after a vaginal delivery and 2 days after a cesarean section. Patients who need to stay longer for medical reasons are allowed to do so, but the goal will be for expedited discharge home.   What if I have COVID-19 and I am in labor? We ask that you wear a mask while on labor and delivery. We will try and accommodate you being placed in a room that is capable of filtering the air. Please call ahead if you are in labor and on your way to the hospital. The phone number for labor and delivery at [facility] is [phone].  If I have COVID-19 when my baby is born how can I prevent my baby from contracting COVID-19? This is an issue that will have to be discussed on a case-by-case basis. Current recommendations suggest providing separate isolation rooms for both the mother and new infant as well as limiting visitors. However, there are practical challenges to this recommendation. The situation will assuredly change and decisions will be influenced by the desires of the mother and availability of space.  Some suggestions are the use of a curtain or physical barrier between mom and infant, hand hygiene, mom wearing a mask, or 6 feet of spacing between a mom and infant.   Can I breastfeed during the COVID-19 pandemic?   Yes, breastfeeding is encouraged.  Can I breastfeed if I have COVID-19? Yes. Covid-19 has not been found in breast milk. This means you cannot give COVID-19 to your child through breast milk. Breast feeding will also help pass antibodies to fight infection to your baby.   What  precautions should I take when breastfeeding if I have COVID-19? If a mother and newborn do room-in and the mother wishes to feed at the breast, she should put on a facemask and practice hand hygiene before each feeding.  What precautions should I take when pumping if I have COVID-19? Prior to expressing breast milk, mothers should practice hand hygiene. After each pumping session, all parts that come into contact with breast milk should be thoroughly washed and the entire pump should be appropriately disinfected per the manufacturer's instructions. This expressed breast milk should be fed to the newborn by a healthy caregiver.  What if I am pregnant and work in healthcare? Based on limited data regarding COVID-19 and pregnancy, ACOG currently does not propose creating additional restrictions on pregnant health care personnel because of COVID-19 alone. Pregnant women do not appear to be at higher risk of severe disease related to COVID-19. Pregnant health care personnel should follow CDC risk assessment and infection control guidelines for health care personnel exposed to patients with suspected or confirmed COVID-19. Adherence to recommended infection prevention and control practices is an important part of protecting all health care personnel in health care settings.    Information on COVID-19 in pregnancy is very limited; however, facilities may want to consider limiting exposure of pregnant health care personnel to patients with confirmed or suspected COVID-19 infection, especially during higher-risk procedures (eg, aerosol-generating procedures), if feasible, based on staffing availability.    

## 2019-12-17 NOTE — Progress Notes (Signed)
New Obstetric Patient H&P    Chief Complaint: "Desires prenatal care"   History of Present Illness: Patient is a 29 y.o. G2P1001 Not Hispanic or Latino female, presents with amenorrhea and positive home pregnancy test. Patient's last menstrual period was 10/30/2019 (exact date). and based on her  LMP, her EDD is Estimated Date of Delivery: 08/05/20 and her EGA is [redacted]w[redacted]d. Cycles are 5. days, regular, and occur approximately every : 25 days. Her last pap smear was 2 years ago and was no abnormalities.    She had a urine pregnancy test which was positive 2 week(s)  ago. Her last menstrual period was normal and lasted for  5 day(s). Since her LMP she claims she has experienced breast tenderness, fatigue. She denies vaginal bleeding. Her past medical history is noncontributory. Her prior pregnancies are notable for G1 2019 FT SVD Female 8#11oz  Since her LMP, she admits to the use of tobacco products  no She claims she has gained   4 pounds since the start of her pregnancy.  There are cats in the home in the home  no  She admits close contact with children on a regular basis  yes  She has had chicken pox in the past yes She has had Tuberculosis exposures, symptoms, or previously tested positive for TB   no Current or past history of domestic violence. no  Genetic Screening/Teratology Counseling: (Includes patient, baby's father, or anyone in either family with:)   42. Patient's age >/= 83 at [facility]  no 2. Thalassemia ([place], [place], [place], or Asian background): MCV<80  no 3. Neural tube defect (meningomyelocele, spina bifida, anencephaly)  no 4. Congenital heart defect  no  5. Down syndrome  no 6. Tay-Sachs (Jewish, [place])  no 7. Canavan's Disease  no 8. Sickle cell disease or trait (African)  no  9. Hemophilia or other blood disorders  no  10. Muscular dystrophy  no  11. Cystic fibrosis  no  12. Huntington's Chorea  no  13. Mental retardation/autism  no 14. Other inherited  genetic or chromosomal disorder  no 15. Maternal metabolic disorder (DM, PKU, etc)  no 16. Patient or FOB with a child with a birth defect not listed above no  16a. Patient or FOB with a birth defect themselves no 17. Recurrent pregnancy loss, or stillbirth  no  18. Any medications since LMP other than prenatal vitamins (include vitamins, supplements, OTC meds, drugs, alcohol)  no 19. Any other genetic/environmental exposure to discuss  no  Infection History:   1. Lives with someone with TB or TB exposed  no  2. Patient or partner has history of genital herpes  no 3. Rash or viral illness since LMP  no 4. History of STI (GC, CT, HPV, syphilis, HIV)  no 5. History of recent travel :  no  Other pertinent information:  no     Review of Systems:10 point review of systems negative unless otherwise noted in HPI  Past Medical History:  Patient Active Problem List   Diagnosis Date Noted  . Supervision of other normal pregnancy, antepartum 12/17/2019    [facility] Prenatal Labs  Dating  Blood type:     Genetic Screen 1 Screen:    AFP:     Quad:     NIPS: Antibody:   Anatomic [place]  Rubella:   Varicella: @VZVIGG @  GTT Early:               Third trimester:  RPR:  Rhogam  HBsAg:     TDaP vaccine                       Flu Shot: HIV:     Baby Food                                GBS:   Contraception  Pap:  CBB     CS/VBAC    Support Person        . Metrorrhagia 12/20/2018  . Gastroesophageal reflux disease 07/13/2017  . Migraine headache 06/18/2017  . History of abnormal cervical Pap smear 09/10/2016    LGSIL/ biopsy +HPV changes     Past Surgical History:  Past Surgical History:  Procedure Laterality Date  . COLPOSCOPY  11/10/2016   [facility] LSIL  . WISDOM TOOTH EXTRACTION      Gynecologic History: Patient's last menstrual period was 10/30/2019 (exact date).  Obstetric History: G2P1001  Family History:  Family History  Problem Relation Age of Onset  . Diabetes  Father   . Heart disease Maternal Grandmother   . Hypertension Maternal Grandmother   . Stroke Maternal Grandmother   . Heart disease Paternal Grandfather        Had heart attack    Social History:  Social History   Socioeconomic History  . Marital status: Single    Spouse name: Not on file  . Number of children: Not on file  . Years of education: Not on file  . Highest education level: Not on file  Occupational History  . Not on file  Tobacco Use  . Smoking status: Former Smoker    Types: Cigarettes  . Smokeless tobacco: Never Used  . Tobacco comment: 1 pack/week of cigarettes. Quit March 2018  Substance and Sexual Activity  . Alcohol use: No    Comment: former  . Drug use: No  . Sexual activity: Yes    Partners: Male    Birth control/protection: None  Other Topics Concern  . Not on file  Social History Narrative  . Not on file   Social Determinants of Health   Financial Resource Strain:   . Difficulty of Paying Living Expenses:   Food Insecurity:   . Worried About Programme researcher, broadcasting/film/video in the Last Year:   . Barista in the Last Year:   Transportation Needs:   . Freight forwarder (Medical):   Marland Kitchen Lack of Transportation (Non-Medical):   Physical Activity:   . Days of Exercise per Week:   . Minutes of Exercise per Session:   Stress:   . Feeling of Stress :   Social Connections:   . Frequency of Communication with Friends and Family:   . Frequency of Social Gatherings with Friends and Family:   . Attends Religious Services:   . Active Member of Clubs or Organizations:   . Attends Banker Meetings:   Marland Kitchen Marital Status:   Intimate Partner Violence:   . Fear of Current or Ex-Partner:   . Emotionally Abused:   Marland Kitchen Physically Abused:   . Sexually Abused:     Allergies:  Allergies  Allergen Reactions  . Other Rash    Nitrile gloves    Medications: Prior to Admission medications   Medication Sig Start Date End Date Taking? Authorizing  Provider  Prenatal MV-Min-FA-Omega-3 (PRENATAL GUMMIES/DHA & FA PO) Take 1 tablet by mouth.   Yes  [provider]    Physical Exam Vitals: Blood pressure 125/77, pulse 69, weight 165 lb (74.8 kg), last menstrual period 10/30/2019, currently breastfeeding.  General: NAD HEENT: normocephalic, anicteric Thyroid: no enlargement, no palpable nodules Pulmonary: No increased work of breathing, CTAB Cardiovascular: RRR, distal pulses 2+ Abdomen: NABS, soft, non-tender, non-distended.  Umbilicus without lesions.  No hepatomegaly, splenomegaly or masses palpable. No evidence of hernia  Genitourinary:  External: Normal external female genitalia.  Normal urethral meatus, normal  Bartholin's and Skene's glands.    Vagina: Normal vaginal mucosa, no evidence of prolapse.    Cervix: Grossly normal in appearance, no bleeding, no CMT  Uterus:  Non-enlarged, mobile, normal contour.    Adnexa: ovaries non-enlarged, no adnexal masses  Rectal: deferred Extremities: no edema, erythema, or tenderness Neurologic: Grossly intact Psychiatric: mood appropriate, affect full  The following were addressed during this visit:  Breastfeeding Education - Early initiation of breastfeeding    Comments: Keeps milk supply adequate, helps contract uterus and slow bleeding, and early milk is the perfect first food and is easy to digest.   - The importance of exclusive breastfeeding    Comments: Provides antibodies, Lower risk of breast and ovarian cancers, and type-2 diabetes,Helps your body recover, Reduced chance of SIDS.   - Risks of giving your baby anything other than breast milk if you are breastfeeding    Comments: Make the baby less content with breastfeeds, may make my baby more susceptible to illness, and may reduce my milk supply.   - The importance of early skin-to-skin contact    Comments: Keeps baby warm and secure, helps keep baby's blood sugar up and breathing steady, easier to bond and  breastfeed, and helps calm baby.  - Rooming-in on a 24-hour basis    Comments: Easier to learn baby's feeding cues, easier to bond and get to know each other, and encourages milk production.   - Feeding on demand or baby-led feeding    Comments: Helps prevent breastfeeding complications, helps bring in good milk supply, prevents under or overfeeding, and helps baby feel content and satisfied   - Frequent feeding to help assure optimal milk production    Comments: Making a full supply of milk requires frequent removal of milk from breasts, infant will eat 8-12 times in 24 hours, if separated from infant use breast massage, hand expression and/ or pumping to remove milk from breasts.   - Effective positioning and attachment    Comments: Helps my baby to get enough breast milk, helps to produce an adequate milk supply, and helps prevent nipple pain and damage   - Exclusive breastfeeding for the first 6 months    Comments: Builds a healthy milk supply and keeps it up, protects baby from sickness and disease, and breastmilk has everything your baby needs for the first 6 months.    Assessment: 29 y.o. G2P1001 at [redacted]w[redacted]d by exact LMP presenting to initiate prenatal care  Plan: 1) Avoid alcoholic beverages. 2) Patient encouraged not to smoke.  3) Discontinue the use of all non-medicinal drugs and chemicals.  4) Take prenatal vitamins daily.  5) Nutrition, food safety (fish, cheese advisories, and high nitrite foods) and exercise discussed. 6) Hospital and practice style discussed with cross coverage system.  7) Genetic Screening, such as with 1st Trimester Screening, cell free fetal DNA, AFP testing, and Ultrasound, as well as with amniocentesis and CVS as appropriate, is discussed with patient. At the conclusion of today's visit patient requested First Trimester Screen  genetic testing 8) Patient is asked about travel to areas at risk for the Zika virus, and counseled to avoid travel and  exposure to mosquitoes or sexual partners who may have themselves been exposed to the virus. Testing is discussed, and will be ordered as appropriate.  9) PAPtima, urine culture, NOB panel today 10) Return to clinic in 1 week for dating scan and rob   Tresea Mall, CNM [facility] 12/17/2019, 2:58 PM

## 2019-12-17 NOTE — Progress Notes (Signed)
NOB 

## 2019-12-18 LAB — RPR+RH+ABO+RUB AB+AB SCR+CB...
Antibody Screen: NEGATIVE
HIV Screen 4th Generation wRfx: NONREACTIVE
Hematocrit: 40.2 % (ref 34.0–46.6)
Hemoglobin: 13.3 g/dL (ref 11.1–15.9)
Hepatitis B Surface Ag: NEGATIVE
MCH: 29.9 pg (ref 26.6–33.0)
MCHC: 33.1 g/dL (ref 31.5–35.7)
MCV: 90 fL (ref 79–97)
Platelets: 201 10*3/uL (ref 150–450)
RBC: 4.45 x10E6/uL (ref 3.77–5.28)
RDW: 11.6 % — ABNORMAL LOW (ref 11.7–15.4)
RPR Ser Ql: NONREACTIVE
Rh Factor: POSITIVE
Rubella Antibodies, IGG: 4.95 index (ref >0.99)
Varicella zoster IgG: 2023 index (ref >165)
WBC: 9.7 10*3/uL (ref 3.4–10.8)

## 2019-12-19 LAB — URINE CULTURE

## 2019-12-19 LAB — CYTOLOGY - PAP
Chlamydia: NEGATIVE
Comment: NEGATIVE
Comment: NEGATIVE
Comment: NEGATIVE
Comment: NORMAL
Diagnosis: NEGATIVE
High risk HPV: NEGATIVE
Neisseria Gonorrhea: NEGATIVE
Trichomonas: NEGATIVE

## 2019-12-23 ENCOUNTER — Encounter: Payer: BC Managed Care – PPO | Admitting: Advanced Practice Midwife

## 2019-12-25 ENCOUNTER — Ambulatory Visit (INDEPENDENT_AMBULATORY_CARE_PROVIDER_SITE_OTHER): Payer: BC Managed Care – PPO

## 2019-12-25 ENCOUNTER — Other Ambulatory Visit: Payer: Self-pay

## 2019-12-25 ENCOUNTER — Other Ambulatory Visit: Payer: Self-pay | Admitting: Advanced Practice Midwife

## 2019-12-25 ENCOUNTER — Encounter: Payer: Self-pay | Admitting: Obstetrics and Gynecology

## 2019-12-25 ENCOUNTER — Ambulatory Visit (INDEPENDENT_AMBULATORY_CARE_PROVIDER_SITE_OTHER): Payer: BC Managed Care – PPO | Admitting: Obstetrics and Gynecology

## 2019-12-25 VITALS — BP 110/62 | Wt 164.0 lb

## 2019-12-25 DIAGNOSIS — Z3480 Encounter for supervision of other normal pregnancy, unspecified trimester: Secondary | ICD-10-CM

## 2019-12-25 DIAGNOSIS — Z3A08 8 weeks gestation of pregnancy: Secondary | ICD-10-CM

## 2019-12-25 DIAGNOSIS — Z3687 Encounter for antenatal screening for uncertain dates: Secondary | ICD-10-CM

## 2019-12-25 DIAGNOSIS — Z3481 Encounter for supervision of other normal pregnancy, first trimester: Secondary | ICD-10-CM

## 2019-12-25 DIAGNOSIS — Z1379 Encounter for other screening for genetic and chromosomal anomalies: Secondary | ICD-10-CM

## 2019-12-25 NOTE — Patient Instructions (Signed)
Initial steps to help :   B6 (pyridoxine) 25 mg,  3-4 times a day - 150 mg total per day Unisom (doxylamine) 25 mg at bedtime **B6 and Unisom are available as a combination prescription medications called diclegis and bonjesta  B1 (thiamin)  50-100 mg 1-4 a day -  150 mg total per day  Continue prenatal vitamin with iron and thiamin. If it is not tolerated switch to 1 mg of folic acid.  Can add medication for gastric reflux if needed.  Subsequent steps to be added to B1, B6, and Unisom:  1. Antihistamine (one of the following medications) Dramamine      25-50 mg every 4-6 hours Benadryl      25-50 mg every 4-6 hours Meclizine      25 mg every 6 hours  2. Dopamine Antagonist (one of the following medications) Metoclopramide  (Reglan)  5-10 mg every 6-8 hours         PO Promethazine   (Phenergan)   12.5-25 mg every 4-6 hours      PO or rectal Prochlorperazine  (Compazine)  5-10 mg every 6-8 hours     25mg  BID rectally     Hyperemesis Gravidarum Hyperemesis gravidarum is a severe form of nausea and vomiting that happens during pregnancy. Hyperemesis is worse than morning sickness. It may cause you to have nausea or vomiting all day for many days. It may keep you from eating and drinking enough food and liquids, which can lead to dehydration, malnutrition, and weight loss. Hyperemesis usually occurs during the first half (the first 20 weeks) of pregnancy. It often goes away once a woman is in her second half of pregnancy. However, sometimes hyperemesis continues through an entire pregnancy. What are the causes? The cause of this condition is not known. It may be related to changes in chemicals (hormones) in the body during pregnancy, such as the high level of pregnancy hormone (human chorionic gonadotropin) or the increase in the female sex hormone (estrogen). What are the signs or symptoms? Symptoms of this condition include:  Nausea that does not go away.  Vomiting that does not allow  you to keep any food down.  Weight loss.  Body fluid loss (dehydration).  Having no desire to eat, or not liking food that you have previously enjoyed. How is this diagnosed? This condition may be diagnosed based on:  A physical exam.  Your medical history.  Your symptoms.  Blood tests.  Urine tests. How is this treated? This condition is managed by controlling symptoms. This may include:  Following an eating plan. This can help lessen nausea and vomiting.  Taking prescription medicines. An eating plan and medicines are often used together to help control symptoms. If medicines do not help relieve nausea and vomiting, you may need to receive fluids through an IV at the hospital. Follow these instructions at home: Eating and drinking   Avoid the following: ? Drinking fluids with meals. Try not to drink anything during the 30 minutes before and after your meals. ? Drinking more than 1 cup of fluid at a time. ? Eating foods that trigger your symptoms. These may include spicy foods, coffee, high-fat foods, very sweet foods, and acidic foods. ? Skipping meals. Nausea can be more intense on an empty stomach. If you cannot tolerate food, do not force it. Try sucking on ice chips or other frozen items and make up for missed calories later. ? Lying down within 2 hours after eating. ? Being  exposed to environmental triggers. These may include food smells, smoky rooms, closed spaces, rooms with strong smells, warm or humid places, overly loud and noisy rooms, and rooms with motion or flickering lights. Try eating meals in a well-ventilated area that is free of strong smells. ? Quick and sudden changes in your movement. ? Taking iron pills and multivitamins that contain iron. If you take prescription iron pills, do not stop taking them unless your health care provider approves. ? Preparing food. The smell of food can spoil your appetite or trigger nausea.  To help relieve your  symptoms: ? Listen to your body. Everyone is different and has different preferences. Find what works best for you. ? Eat and drink slowly. ? Eat 5-6 small meals daily instead of 3 large meals. Eating small meals and snacks can help you avoid an empty stomach. ? In the morning, before getting out of bed, eat a couple of crackers to avoid moving around on an empty stomach. ? Try eating starchy foods as these are usually tolerated well. Examples include cereal, toast, bread, potatoes, pasta, rice, and pretzels. ? Include at least 1 serving of protein with your meals and snacks. Protein options include lean meats, poultry, seafood, beans, nuts, nut butters, eggs, cheese, and yogurt. ? Try eating a protein-rich snack before bed. Examples of a protein-rick snack include cheese and crackers or a peanut butter sandwich made with 1 slice of whole-wheat bread and 1 tsp (5 g) of peanut butter. ? Eat or suck on things that have ginger in them. It may help relieve nausea. Add  tsp ground ginger to hot tea or choose ginger tea. ? Try drinking 100% fruit juice or an electrolyte drink. An electrolyte drink contains sodium, potassium, and chloride. ? Drink fluids that are cold, clear, and carbonated or sour. Examples include lemonade, ginger ale, lemon-lime soda, ice water, and sparkling water. ? Brush your teeth or use a mouth rinse after meals. ? Talk with your health care provider about starting a supplement of vitamin B6. General instructions  Take over-the-counter and prescription medicines only as told by your health care provider.  Follow instructions from your health care provider about eating or drinking restrictions.  Continue to take your prenatal vitamins as told by your health care provider. If you are having trouble taking your prenatal vitamins, talk with your health care provider about different options.  Keep all follow-up and pre-birth (prenatal) visits as told by your health care provider.  This is important. Contact a health care provider if:  You have pain in your abdomen.  You have a severe headache.  You have vision problems.  You are losing weight.  You feel weak or dizzy. Get help right away if:  You cannot drink fluids without vomiting.  You vomit blood.  You have constant nausea and vomiting.  You are very weak.  You faint.  You have a fever and your symptoms suddenly get worse. Summary  Hyperemesis gravidarum is a severe form of nausea and vomiting that happens during pregnancy.  Making some changes to your eating habits may help relieve nausea and vomiting.  This condition may be managed with medicine.  If medicines do not help relieve nausea and vomiting, you may need to receive fluids through an IV at the hospital. This information is not intended to replace advice given to you by your health care provider. Make sure you discuss any questions you have with your health care provider. Document Revised: 10/01/2017 Document Reviewed:  05/10/2016 Elsevier Patient Education  2020 [employer].    Subsequent steps if there has still not been improvement in symptoms:  3. Daily stool softner  4. Ondansetron  (Zofran)   4-8 mg every 6-8 hours

## 2019-12-25 NOTE — Progress Notes (Signed)
ROB C/o nausea Slight cramping/no spotting

## 2019-12-25 NOTE — Progress Notes (Signed)
    Routine Prenatal Care Visit  Subjective  Marissa Herman is a 29 y.o. G2P1001 at [redacted]w[redacted]d being seen today for ongoing prenatal care.  She is currently monitored for the following issues for this low-risk pregnancy and has History of abnormal cervical Pap smear; Migraine headache; Gastroesophageal reflux disease; Metrorrhagia; and Supervision of other normal pregnancy, antepartum on their problem list.  ----------------------------------------------------------------------------------- Patient reports nausea and diarrhea for 1 day.   Contractions: Not present. Vag. Bleeding: None.  Movement: Absent. Denies leaking of fluid.  ----------------------------------------------------------------------------------- The following portions of the patient's history were reviewed and updated as appropriate: allergies, current medications, past family history, past medical history, past social history, past surgical history and problem list. Problem list updated.   Objective  Blood pressure 110/62, weight 164 lb (74.4 kg), last menstrual period 10/30/2019, currently breastfeeding. Pregravid weight 161 lb (73 kg) Total Weight Gain 3 lb (1.361 kg) Urinalysis:      Fetal Status: Fetal Heart Rate (bpm): 168   Movement: Absent     General:  Alert, oriented and cooperative. Patient is in no acute distress.  Skin: Skin is warm and dry. No rash noted.   Cardiovascular: Normal heart rate noted  Respiratory: Normal respiratory effort, no problems with respiration noted  Abdomen: Soft, gravid, appropriate for gestational age. Pain/Pressure: Absent     Pelvic:  Cervical exam deferred        Extremities: Normal range of motion.  Edema: None  Mental Status: Normal mood and affect. Normal behavior. Normal judgment and thought content.     Assessment   29 y.o. G2P1001 at [redacted]w[redacted]d by  08/05/2020, by Last Menstrual Period presenting for routine prenatal visit  Plan   Pregnancy#2 Problems (from 10/30/19 to present)     Problem Noted Resolved   Supervision of other normal pregnancy, antepartum 12/17/2019 by Tresea Mall, CNM No   Overview Addendum 12/25/2019  9:30 AM by Natale Milch, MD    Clinic [facility] Prenatal Labs  Dating  LMP = 8wk [place] Blood type: O/Positive/-- (03/24 1521)   Genetic Screen 1 Screen:  desired Antibody:Negative (03/24 1521)  Anatomic [place]  Rubella: 4.95 (03/24 1521) Varicella: Immune  GTT 28 wk:  RPR: Non Reactive (03/24 1521)   Rhogam  not needed HBsAg: Negative (03/24 1521)   Vaccines TDAP:                       Flu Shot: HIV: Non Reactive (03/24 1521)   Baby Food Breast                               GBS:   Contraception  Pap: 2019 neg f/u for abnormal 12/17/19: NIL/neg HPV  CBB     CS/VBAC NA   Support Person Husband Kandee Keen            Discussed managed of diarrhea and nausea with OTC medications. Encouraged to call if symptoms worsen. Desires 1st trimester screening.   Gestational age appropriate obstetric precautions including but not limited to vaginal bleeding, contractions, leaking of fluid and fetal movement were reviewed in detail with the patient.    Return in about 4 weeks (around 01/22/2020) for ROB in person and [place]- NT scan.  Natale Milch MD [facility], [facility] 12/25/2019, 9:31 AM

## 2020-01-22 ENCOUNTER — Ambulatory Visit (INDEPENDENT_AMBULATORY_CARE_PROVIDER_SITE_OTHER): Payer: BC Managed Care – PPO

## 2020-01-22 ENCOUNTER — Ambulatory Visit (INDEPENDENT_AMBULATORY_CARE_PROVIDER_SITE_OTHER): Payer: BC Managed Care – PPO | Admitting: Obstetrics and Gynecology

## 2020-01-22 ENCOUNTER — Encounter: Payer: Self-pay | Admitting: Obstetrics and Gynecology

## 2020-01-22 ENCOUNTER — Other Ambulatory Visit: Payer: Self-pay

## 2020-01-22 VITALS — BP 110/70 | Wt 162.0 lb

## 2020-01-22 DIAGNOSIS — Z3A12 12 weeks gestation of pregnancy: Secondary | ICD-10-CM

## 2020-01-22 DIAGNOSIS — Z1379 Encounter for other screening for genetic and chromosomal anomalies: Secondary | ICD-10-CM

## 2020-01-22 DIAGNOSIS — Z3480 Encounter for supervision of other normal pregnancy, unspecified trimester: Secondary | ICD-10-CM

## 2020-01-22 NOTE — Progress Notes (Signed)
Routine Prenatal Care Visit  Subjective  Marissa Herman is a 29 y.o. G2P1001 at [redacted]w[redacted]d being seen today for ongoing prenatal care.  She is currently monitored for the following issues for this low-risk pregnancy and has History of abnormal cervical Pap smear; Migraine headache; Gastroesophageal reflux disease; Metrorrhagia; and Supervision of other normal pregnancy, antepartum on their problem list.  ----------------------------------------------------------------------------------- Patient reports no complaints.   Contractions: Not present. Vag. Bleeding: None.  Movement: Absent. Denies leaking of fluid.  ----------------------------------------------------------------------------------- The following portions of the patient's history were reviewed and updated as appropriate: allergies, current medications, past family history, past medical history, past social history, past surgical history and problem list. Problem list updated.   Objective  Blood pressure 110/70, weight 162 lb (73.5 kg), last menstrual period 10/30/2019, currently breastfeeding. Pregravid weight 161 lb (73 kg) Total Weight Gain 1 lb (0.454 kg) Urinalysis:      Fetal Status: Fetal Heart Rate (bpm): 164   Movement: Absent     General:  Alert, oriented and cooperative. Patient is in no acute distress.  Skin: Skin is warm and dry. No rash noted.   Cardiovascular: Normal heart rate noted  Respiratory: Normal respiratory effort, no problems with respiration noted  Abdomen: Soft, gravid, appropriate for gestational age. Pain/Pressure: Absent     Pelvic:  Cervical exam deferred        Extremities: Normal range of motion.     ental Status: Normal mood and affect. Normal behavior. Normal judgment and thought content.   US OB Transvaginal  Result Date: 12/25/2019 Patient Name: Marissa Herman DOB: 1991/05/13 MRN: [id number] ULTRASOUND REPORT Location: [facility] Date of Service: 12/25/2019 Indications:dating Findings:  Mason Jim intrauterine pregnancy is visualized with a CRL consistent with [redacted]w[redacted]d gestation, giving an (U/S) EDD of 08/04/2020. The (U/S) EDD is consistent with the clinically established EDD of 08/05/2020. FHR: 168 BPM CRL measurement: 16.5 mm Yolk sac is visualized and appears normal. Amnion: visualized and appears normal Right Ovary is normal in appearance. Left Ovary is normal appearance. Corpus luteal cyst:  Right ovary Survey of the adnexa demonstrates no adnexal masses. There is no free peritoneal fluid in the cul de sac. Impression: 1. [redacted]w[redacted]d Viable Singleton Intrauterine pregnancy by U/S. 2. (U/S) EDD is consistent with Clinically established EDD of 08/05/2020. Recommendations: 1.Clinical correlation with the patient's History and Physical Exam. Deanna Artis, RT I have reviewed this ultrasound and the report. I agree with the above assessment and plan. Adelene Idler MD [facility] 12/25/19 9:13 AM    US Fetal Nuchal Translucency Measurement  Result Date: 01/22/2020 Patient Name: Marissa Herman DOB: Jan 09, 1991 MRN: [id number] ULTRASOUND REPORT Location: [facility] Date of Service: 01/22/2020 Indications:First Trimester Screen - NT Findings: Singleton intrauterine pregnancy is visualized with a CRL consistent with [redacted]w[redacted]d  gestation, giving an (U/S) EDD of 08/01/2020. The (U/S) EDD is consistent with the clinically established Estimated Date of Delivery: 08/05/20. FHR: 164 BPM CRL measurement: 62.0 mm NT measurement: 1.2 mm. Amnion is visualized. Nasal Bone is Present Impression: 1. [redacted]w[redacted]d viable Singleton Intrauterine pregnancy by U/S. 2. (U/S) EDD is consistent with Clinically established Estimated Date of Delivery: 08/05/20. 3. NT Screen is successfully completed. Recommendations: 1.Clinical correlation with the patient's History and Physical Exam. Deanna Artis, RT There is a viable  singleton gestation.  The fetal biometry correlates with established dating  Detailed evaluation of the fetal anatomy is precluded by early gestational age.The NT measurement is less than 1mm.  It must be noted that a normal ultrasound is unable to definitively rule out fetal aneuploidy.  Malachy Mood, MD, [facility], [facility] 01/22/2020, 9:23 AM.     Assessment   29 y.o. G2P1001 at [redacted]w[redacted]d by  08/05/2020, by Last Menstrual Period presenting for routine prenatal visit  Plan   Pregnancy#2 Problems (from 10/30/19 to present)    Problem Noted Resolved   Supervision of other normal pregnancy, antepartum 12/17/2019 by Rod Can, CNM No   Overview Addendum 12/25/2019  9:30 AM by Homero Fellers, MD    Clinic [facility] Prenatal Labs  Dating  LMP = 8wk [place] Blood type: O/Positive/-- (03/24 1521)   Genetic Screen 1 Screen:  desired Antibody:Negative (03/24 1521)  Anatomic [place]  Rubella: 4.95 (03/24 1521) Varicella: Immune  GTT 28 wk:  RPR: Non Reactive (03/24 1521)   Rhogam  not needed HBsAg: Negative (03/24 1521)   Vaccines TDAP:                       Flu Shot: HIV: Non Reactive (03/24 1521)   Baby Food Breast                               GBS:   Contraception  Pap: 2019 neg f/u for abnormal 12/17/19: NIL/neg HPV  CBB     CS/VBAC NA   Support Person Husband Tommi Rumps              Gestational age appropriate obstetric precautions including but not limited to vaginal bleeding, contractions, leaking of fluid and fetal movement were reviewed in detail with the patient.    1st trimester screen, discussed inheritest  Return in about 4 weeks (around 02/19/2020).  Malachy Mood, MD, [facility], [facility] 01/22/2020, 9:38 AM

## 2020-01-22 NOTE — Patient Instructions (Signed)
This is a very exciting time for you and your family, so congratulations from everybody here at [facility]! You have just embarked on a very amazing journey. The next several months will be filled with wondrous emotions and miraculous memories. As you begin your preparations, this office wanted you to be aware of a few prenatal genetic laboratory tests that are available to you early in your pregnancy. These tests are optional and you may decide to opt out of the testing.   Patients often voice their desire to opt out of this testing as it would not change their decisions on continuing the pregnancy.  However, our providers value the information they receive from these tests as they allow us to optimize the care for you and your baby prior to delivery.    There are 6 genetic laboratory tests this office offers, and they test for a variety of different genetic diseases or chromosomal abnormalities. By utilizing these tests, the providers can better understand your risk associated with passing on a genetic condition to your child. These tests are screening tests, and are not used to diagnose any condition. If one of these tests results abnormal, then a diagnostic test will be offered to you. It is important to remember that most pregnancies will result in a beautiful and healthy baby. Knowing the results of these tests will also help you better prepare for your delivery.  We encourage you to read over the brief descriptions below and engage with your provider regarding any additional questions you may have.  Please also make your provider aware if you or your partner has any Jewish ancestry as there may be additional testing that you qualify for. The tests that will be ordered are: 1. Cystic Fibrosis Carrier Screen1 (Blood Test): The most common autosomal recessive disorder. This disease causes thick mucus to build up in the lungs, which leads to repetitive chest infections. The carrier frequency in caucasians is  roughly 1 in 30 people in the [place], but varies by ethnicity.   2. Spinal Muscular Atrophy Carrier Screen1 (Blood Test):  The second most common autosomal recessive disorder and the most common inherited form of early childhood death. This is degenerative neuromuscular condition that affects the child's ability to sit, smile, breath, swallow, etc. The carrier frequency is the [place] is roughly 1 in 54, but varies by ethnicity. 3. Fragile X Syndrome Carrier Screen1 (Blood Test): The most common form of inherited mental retardation. The severity of the retardation varies. Fragile X is most commonly passed from mother to child, and 1 in 260 people in the [place] are carriers. 4. Downs Syndrome, Trisomy 18: Trisomy 21 ( Downs Syndrome) and Trisomy 18 are common forms of mental retardation due to chromosomal abnormalities. Downs is the milder of the two, and it occurs 1 in 700 live births. Trisomy 18 is a more severe form of mental retardation, and occurs in 1 in 6000 live births. The risk of conceiving a baby with one of these two genetic conditions is independent of family history.  The test offered to you will depend on how far along you are in your pregnancy.  The main risk factor that has been identified as predisposing to either trisomy 21 or trisomy 18 is the age of the mother, with increased risk for those patients over the age of 35 at the time of delivery.  If you are deemed high risk on the initial screening test or are over 35 you may be a   candidate for cell free fetal DNA testing or amniocentesis outlined below.  See #6, 7, 8, and 9 for available testing options 5.  MSAFP2 Maternal Serum Alpha Feto-Protein (Blood Test):  Open Neural Tube Defects deal with an opening in the spinal column that does not close properly during pregnancy. It occurs when the tissues that fold to form the neural tube do not close or do not stay closed completely. This causes an opening in the vertebrae,  which surround and protect the spinal cord. The most common form is Spina Bifida. It occurs in 1 in 1000 live births. The folic acid in prenatal vitamins can decrease the risk of you baby developing this birth defect.  If you have had a prior pregnancy complicated by spina bifida you may need higher doses of folic acid.   6. 1st trimester screening2 (Blood Test):  this test is a combination of an ultrasound measurement of your baby's neck and blood work ideally obtained between 11 weeks and 13 weeks 6 days gestation.  It tests for #4 above 7. Tetra Screen2 (Blood Test):  this is a combined blood test offered at 15 to [redacted] weeks gestation.  It tests for conditions #4 and #5 above 8. Cell Free Fetal DNA2 (Blood Test):  this test is available for our patient over 35 or patient who were found to be at increased risk for down syndrome or trisomy 18 based on either 1st trimester screening results or serum tetra screening.  At present this testing is still considered screening rather than diagnostic 9. Amniocentesis - this test is available for our patient over 35 or patient who were found to be at increased risk for down syndrome or trisomy 18 based on either 1st trimester screening results or serum tetra screening.  This test involves using a needle to draw off some of the fluid from around the baby in order to determine if the fetus is affected by either trisomy 21 (Downs Syndrome) or trisomy 18.  In addition this test may be suggested or offered by your provider in other circumstances.  This test is considered diagnostic as opposed to screening meaning that it can definitively rule in or rule out the tested condition.  Unlike the other testing discussed amniocentesis is an invasive procedure and is associated with a small risk (approximately 1 in 200) of resulting in a miscarriage. 1. It is also important to notate that if you screen positive for carrier status for Cystic Fibrosis or Spinal Muscular Atrophy, that  does not mean your child will be affected with the condition. The child's father will also need to be tested. If both of you are carriers, then there is a 25% that you will have an affected child. The risk of having a child affected with Downs Syndrome or Trisomy 18 varies by maternal age. There is not a "carrier" status for these conditions. If you would like more information of these conditions, please see the handouts in your packet of information. 2. Denotes screening tests.   These tests can assess the risk of the pregnancy being affected by a particular condition.  It is important to note that even if testing deems that you are at increased risk your baby may still be unaffected.  Conversely, test results indicating that your baby is at low risk for the tested condition does not rule out that the baby could still be affected by that condition  

## 2020-01-24 LAB — FIRST TRIMESTER SCREEN W/NT
CRL: 62 mm
DIA MoM: 0.53
DIA Value: 110.8 pg/mL
Gest Age-Collect: 12.4 weeks
Maternal Age At EDD: 31.6 yr
Nuchal Translucency MoM: 0.83
Nuchal Translucency: 1.2 mm
Number of Fetuses: 1
PAPP-A MoM: 0.58
PAPP-A Value: 508.4 ng/mL
Sonographer ID#: 83416
Test Results:: NEGATIVE
Weight: 165 [lb_av]
hCG MoM: 1.07
hCG Value: 93.3 IU/mL

## 2020-02-24 ENCOUNTER — Other Ambulatory Visit: Payer: Self-pay

## 2020-02-24 ENCOUNTER — Ambulatory Visit (INDEPENDENT_AMBULATORY_CARE_PROVIDER_SITE_OTHER): Payer: BC Managed Care – PPO | Admitting: Certified Nurse Midwife

## 2020-02-24 VITALS — BP 100/60 | Wt 167.0 lb

## 2020-02-24 DIAGNOSIS — Z3482 Encounter for supervision of other normal pregnancy, second trimester: Secondary | ICD-10-CM

## 2020-02-24 DIAGNOSIS — Z3A16 16 weeks gestation of pregnancy: Secondary | ICD-10-CM

## 2020-02-24 DIAGNOSIS — Z363 Encounter for antenatal screening for malformations: Secondary | ICD-10-CM

## 2020-02-24 DIAGNOSIS — Z1379 Encounter for other screening for genetic and chromosomal anomalies: Secondary | ICD-10-CM

## 2020-02-24 LAB — POCT URINALYSIS DIPSTICK OB
Glucose, UA: NEGATIVE
POC,PROTEIN,UA: NEGATIVE

## 2020-02-24 NOTE — Progress Notes (Signed)
ROB at 16wk5d: Doing well. Unsure if feeling fetal movement/ flutters. Some problems with constipation. Had a hard BM followed by some rectal bleeding last week. Has a hx of heorrhoids.  Has been eating Fiberone and trying to drink 4 quarts of water a day.  Has a small skin lesion on left breast near the areola, ?pimple vs bite mark.  First trimester test was negative  Exam: FH at 1/2 between U and SP. FHT 158 BPM  A: IUP at 16wk5d S=D Constipation/ Hemorrhoids  P: Discussed use of Benefiber or Colace during pregnancy Desires MSAFP-drawn today ROB and anatomy scan in 2-3 weeks  Farrel Conners, CNM

## 2020-02-24 NOTE — Progress Notes (Signed)
ROB-pt has been having sleeping problems, bump on left breast

## 2020-02-26 LAB — AFP, SERUM, OPEN SPINA BIFIDA
AFP MoM: 1.12
AFP Value: 39.3 ng/mL
Gest. Age on Collection Date: 16.7 weeks
Maternal Age At EDD: 31.6 yr
OSBR Risk 1 IN: 8371
Test Results:: NEGATIVE
Weight: 167 [lb_av]

## 2020-03-16 ENCOUNTER — Ambulatory Visit (INDEPENDENT_AMBULATORY_CARE_PROVIDER_SITE_OTHER): Payer: BC Managed Care – PPO

## 2020-03-16 ENCOUNTER — Other Ambulatory Visit: Payer: Self-pay

## 2020-03-16 ENCOUNTER — Ambulatory Visit (INDEPENDENT_AMBULATORY_CARE_PROVIDER_SITE_OTHER): Payer: BC Managed Care – PPO | Admitting: Certified Nurse Midwife

## 2020-03-16 VITALS — BP 100/70 | Ht 65.5 in | Wt 171.4 lb

## 2020-03-16 DIAGNOSIS — Z3482 Encounter for supervision of other normal pregnancy, second trimester: Secondary | ICD-10-CM

## 2020-03-16 DIAGNOSIS — Z363 Encounter for antenatal screening for malformations: Secondary | ICD-10-CM | POA: Diagnosis not present

## 2020-03-16 DIAGNOSIS — Z3A19 19 weeks gestation of pregnancy: Secondary | ICD-10-CM

## 2020-03-16 LAB — POCT URINALYSIS DIPSTICK OB
Glucose, UA: NEGATIVE
POC,PROTEIN,UA: NEGATIVE

## 2020-03-21 MED ORDER — FAMOTIDINE 20 MG PO TABS: 20.0000 mg | ORAL_TABLET | Freq: Two times a day (BID) | ORAL | 3 refills | Status: DC

## 2020-03-21 NOTE — Progress Notes (Signed)
Anatomy scan at 19wk5d: Feeling fetal movement. Having reflux unresponsive to Tums.   Wt up 4# over 3 weeks. BP 100/70  Anatomy scan: CGA 19wk6d, normal anatomy, female gender, anterior placenta. FCA 161  A: IUP at 19wk5d S=D  P: RX for Pepcid 20 mgm BID. Discussed not lying down for at least 2 hours after eating ROB in 4 weeks.  Farrel Conners, CNM

## 2020-04-13 ENCOUNTER — Other Ambulatory Visit: Payer: Self-pay

## 2020-04-13 ENCOUNTER — Ambulatory Visit (INDEPENDENT_AMBULATORY_CARE_PROVIDER_SITE_OTHER): Payer: BC Managed Care – PPO | Admitting: Obstetrics

## 2020-04-13 VITALS — BP 106/70 | Ht 65.5 in | Wt 181.0 lb

## 2020-04-13 DIAGNOSIS — Z3482 Encounter for supervision of other normal pregnancy, second trimester: Secondary | ICD-10-CM

## 2020-04-13 DIAGNOSIS — Z3A23 23 weeks gestation of pregnancy: Secondary | ICD-10-CM

## 2020-04-13 LAB — POCT URINALYSIS DIPSTICK OB
Glucose, UA: NEGATIVE
POC,PROTEIN,UA: NEGATIVE

## 2020-04-13 NOTE — Progress Notes (Signed)
  Routine Prenatal Care Visit  Subjective  Marissa Herman is a 29 y.o. G2P1001 at [redacted]w[redacted]d being seen today for ongoing prenatal care.  She is currently monitored for the following issues for this low-risk pregnancy and has History of abnormal cervical Pap smear; Migraine headache; Gastroesophageal reflux disease; Metrorrhagia; and Supervision of other normal pregnancy, antepartum on their problem list.  ----------------------------------------------------------------------------------- Patient reports no complaints.  Her baby is active. She is still undecided on a name for her daughter. Middle name  Will be "Violet" Contractions: Not present. Vag. Bleeding: None.   . Leaking Fluid denies.  ----------------------------------------------------------------------------------- The following portions of the patient's history were reviewed and updated as appropriate: allergies, current medications, past family history, past medical history, past social history, past surgical history and problem list. Problem list updated.  Objective  Blood pressure 106/70, height 5' 5.5" (1.664 m), weight 181 lb (82.1 kg), last menstrual period 10/30/2019, currently breastfeeding. Pregravid weight 161 lb (73 kg) Total Weight Gain 20 lb (9.072 kg) Urinalysis: Urine Protein Negative  Urine Glucose Negative  Fetal Status:           General:  Alert, oriented and cooperative. Patient is in no acute distress.  Skin: Skin is warm and dry. No rash noted.   Cardiovascular: Normal heart rate noted  Respiratory: Normal respiratory effort, no problems with respiration noted  Abdomen: Soft, gravid, appropriate for gestational age. Pain/Pressure: Absent     Pelvic:  Cervical exam deferred        Extremities: Normal range of motion.  Edema: None  Mental Status: Normal mood and affect. Normal behavior. Normal judgment and thought content.   Assessment   29 y.o. G2P1001 at [redacted]w[redacted]d by  08/05/2020, by Last Menstrual Period presenting  for routine prenatal visit  Plan   Pregnancy#2 Problems (from 10/30/19 to present)    Problem Noted Resolved   Supervision of other normal pregnancy, antepartum 12/17/2019 by Tresea Mall, CNM No   Overview Addendum 02/27/2020  2:19 PM by Farrel Conners, CNM    Clinic [facility] Prenatal Labs  Dating  LMP = 8wk [place] Blood type: O/Positive/-- (03/24 1521)   Genetic Screen 1 Screen:  Screen negative, MSAFP negative Antibody:Negative (03/24 1521)  Anatomic [place]  Rubella: 4.95 (03/24 1521) Varicella: Immune  GTT 28 wk:  RPR: Non Reactive (03/24 1521)   Rhogam  not needed HBsAg: Negative (03/24 1521)   Vaccines TDAP:                       Flu Shot: HIV: Non Reactive (03/24 1521)   Baby Food Breast                               GBS:   Contraception  Pap: 2019 neg f/u for abnormal 12/17/19: NIL/neg HPV  CBB     CS/VBAC NA   Support Person Husband Kandee Keen          Previous Version       Preterm labor symptoms and general obstetric precautions including but not limited to vaginal bleeding, contractions, leaking of fluid and fetal movement were reviewed in detail with the patient. Please refer to After Visit Summary for other counseling recommendations.   Return in about 4 weeks (around 05/11/2020) for  and 1hr GTT.  Mirna Mires, CNM  04/13/2020 12:10 PM

## 2020-04-15 ENCOUNTER — Telehealth: Payer: Self-pay

## 2020-04-15 NOTE — Telephone Encounter (Signed)
Pt calling; works at [employer]; they are requiring Covid vaccine; is it okay to get it during pregnancy?  [id number]  Adv studies have shown no adverse reaction to the baby; adv we encourage our pts to get it; gave # to HD.  Adv if would feel better talking to one of our providers to call and sched appt.

## 2020-05-04 ENCOUNTER — Ambulatory Visit (INDEPENDENT_AMBULATORY_CARE_PROVIDER_SITE_OTHER): Payer: BC Managed Care – PPO | Admitting: Obstetrics and Gynecology

## 2020-05-04 ENCOUNTER — Other Ambulatory Visit: Payer: Self-pay

## 2020-05-04 ENCOUNTER — Other Ambulatory Visit: Payer: BC Managed Care – PPO

## 2020-05-04 VITALS — BP 112/66 | Wt 186.0 lb

## 2020-05-04 DIAGNOSIS — Z3A26 26 weeks gestation of pregnancy: Secondary | ICD-10-CM

## 2020-05-04 DIAGNOSIS — R102 Pelvic and perineal pain: Secondary | ICD-10-CM

## 2020-05-04 DIAGNOSIS — Z3482 Encounter for supervision of other normal pregnancy, second trimester: Secondary | ICD-10-CM

## 2020-05-04 DIAGNOSIS — Z3480 Encounter for supervision of other normal pregnancy, unspecified trimester: Secondary | ICD-10-CM

## 2020-05-04 DIAGNOSIS — Z3A23 23 weeks gestation of pregnancy: Secondary | ICD-10-CM

## 2020-05-04 LAB — POCT URINALYSIS DIPSTICK OB
Glucose, UA: NEGATIVE
POC,PROTEIN,UA: NEGATIVE

## 2020-05-04 NOTE — Progress Notes (Signed)
    Routine Prenatal Care Visit  Subjective  Marissa Herman is a 29 y.o. G2P1001 at [redacted]w[redacted]d being seen today for ongoing prenatal care.  She is currently monitored for the following issues for this low-risk pregnancy and has History of abnormal cervical Pap smear; Migraine headache; Gastroesophageal reflux disease; Metrorrhagia; and Supervision of other normal pregnancy, antepartum on their problem list.  ----------------------------------------------------------------------------------- Patient reports contractions yesterday.  Some regular painful contractions yesterday which have since subsided.   Contractions: Irregular. Vag. Bleeding: None.  Movement: Present. Denies leaking of fluid.  ----------------------------------------------------------------------------------- The following portions of the patient's history were reviewed and updated as appropriate: allergies, current medications, past family history, past medical history, past social history, past surgical history and problem list. Problem list updated.   Objective  Blood pressure 112/66, weight 186 lb (84.4 kg), last menstrual period 10/30/2019, currently breastfeeding. Pregravid weight 161 lb (73 kg) Total Weight Gain 25 lb (11.3 kg) Urinalysis:      Fetal Status: Fetal Heart Rate (bpm): 150 Fundal Height: 25 cm Movement: Present     General:  Alert, oriented and cooperative. Patient is in no acute distress.  Skin: Skin is warm and dry. No rash noted.   Cardiovascular: Normal heart rate noted  Respiratory: Normal respiratory effort, no problems with respiration noted  Abdomen: Soft, gravid, appropriate for gestational age. Pain/Pressure: Absent     Pelvic:  Cervical exam performed Dilation: Closed      Extremities: Normal range of motion.     ental Status: Normal mood and affect. Normal behavior. Normal judgment and thought content.     Assessment   29 y.o. G2P1001 at [redacted]w[redacted]d by  08/05/2020, by Last Menstrual Period  presenting for routine prenatal visit  Plan   Pregnancy#2 Problems (from 10/30/19 to present)    Problem Noted Resolved   Supervision of other normal pregnancy, antepartum 12/17/2019 by Tresea Mall, CNM No   Overview Addendum 02/27/2020  2:19 PM by Farrel Conners, CNM    Clinic [facility] Prenatal Labs  Dating  LMP = 8wk [place] Blood type: O/Positive/-- (03/24 1521)   Genetic Screen 1 Screen:  Screen negative, MSAFP negative Antibody:Negative (03/24 1521)  Anatomic [place]  Rubella: 4.95 (03/24 1521) Varicella: Immune  GTT 28 wk:  RPR: Non Reactive (03/24 1521)   Rhogam  not needed HBsAg: Negative (03/24 1521)   Vaccines TDAP:                       Flu Shot: HIV: Non Reactive (03/24 1521)   Baby Food Breast                               GBS:   Contraception  Pap: 2019 neg f/u for abnormal 12/17/19: NIL/neg HPV  CBB     CS/VBAC NA   Support Person Husband Marissa Herman          Previous Version       Gestational age appropriate obstetric precautions including but not limited to vaginal bleeding, contractions, leaking of fluid and fetal movement were reviewed in detail with the patient.    - 28 week labs today  Return in about 2 weeks (around 05/18/2020) for ROB.  Vena Austria, MD, [facility], [facility] 05/04/2020, 10:54 AM

## 2020-05-04 NOTE — Progress Notes (Signed)
ROB 28 week labs cramping

## 2020-05-05 LAB — CBC WITH DIFFERENTIAL/PLATELET
Basophils Absolute: 0 10*3/uL (ref 0.0–0.2)
Basos: 0 %
EOS (ABSOLUTE): 0.1 10*3/uL (ref 0.0–0.4)
Eos: 1 %
Hematocrit: 36.1 % (ref 34.0–46.6)
Hemoglobin: 12 g/dL (ref 11.1–15.9)
Immature Grans (Abs): 0 10*3/uL (ref 0.0–0.1)
Immature Granulocytes: 0 %
Lymphocytes Absolute: 2.6 10*3/uL (ref 0.7–3.1)
Lymphs: 23 %
MCH: 31 pg (ref 26.6–33.0)
MCHC: 33.2 g/dL (ref 31.5–35.7)
MCV: 93 fL (ref 79–97)
Monocytes Absolute: 0.7 10*3/uL (ref 0.1–0.9)
Monocytes: 6 %
Neutrophils Absolute: 8 10*3/uL — ABNORMAL HIGH (ref 1.4–7.0)
Neutrophils: 70 %
Platelets: 208 10*3/uL (ref 150–450)
RBC: 3.87 x10E6/uL (ref 3.77–5.28)
RDW: 11.8 % (ref 11.7–15.4)
WBC: 11.5 10*3/uL — ABNORMAL HIGH (ref 3.4–10.8)

## 2020-05-05 LAB — GLUCOSE, 1 HOUR GESTATIONAL: Gestational Diabetes Screen: 60 mg/dL — ABNORMAL LOW (ref 65–139)

## 2020-05-06 LAB — URINE CULTURE

## 2020-05-18 ENCOUNTER — Ambulatory Visit (INDEPENDENT_AMBULATORY_CARE_PROVIDER_SITE_OTHER): Payer: BC Managed Care – PPO | Admitting: Certified Nurse Midwife

## 2020-05-18 ENCOUNTER — Other Ambulatory Visit: Payer: Self-pay

## 2020-05-18 VITALS — BP 113/73 | Wt 188.0 lb

## 2020-05-18 DIAGNOSIS — O4443 Low lying placenta NOS or without hemorrhage, third trimester: Secondary | ICD-10-CM

## 2020-05-18 DIAGNOSIS — Z3A28 28 weeks gestation of pregnancy: Secondary | ICD-10-CM

## 2020-05-18 DIAGNOSIS — Z3483 Encounter for supervision of other normal pregnancy, third trimester: Secondary | ICD-10-CM

## 2020-05-18 DIAGNOSIS — Z113 Encounter for screening for infections with a predominantly sexual mode of transmission: Secondary | ICD-10-CM

## 2020-05-18 LAB — POCT URINALYSIS DIPSTICK OB
Glucose, UA: NEGATIVE
POC,PROTEIN,UA: NEGATIVE

## 2020-05-18 NOTE — Progress Notes (Signed)
No concerns.rj 

## 2020-05-18 NOTE — Progress Notes (Signed)
ROB at 28wk5d: Doing well. Has some irregular contractions. No leakage of fluid or vaginal bleeding. Baby active  1hour GTT and CBC WNL.  Exam: 113/73 WT 188# (up 2#), FH 29cm FHT 151  A: IUP at 28wk5d S=D  P: Wants to breast feed Interested in permanent sterilization-will probably wait and have interval tubal. [place] for placental location and ROB and RPR and HIV in 2 weeks.   Marissa Herman, CNM

## 2020-05-26 ENCOUNTER — Ambulatory Visit (INDEPENDENT_AMBULATORY_CARE_PROVIDER_SITE_OTHER): Payer: BC Managed Care – PPO | Admitting: Advanced Practice Midwife

## 2020-05-26 ENCOUNTER — Encounter: Payer: Self-pay | Admitting: Advanced Practice Midwife

## 2020-05-26 ENCOUNTER — Telehealth: Payer: Self-pay

## 2020-05-26 ENCOUNTER — Other Ambulatory Visit: Payer: Self-pay

## 2020-05-26 VITALS — BP 129/85 | HR 92 | Wt 188.0 lb

## 2020-05-26 DIAGNOSIS — Z3A29 29 weeks gestation of pregnancy: Secondary | ICD-10-CM

## 2020-05-26 DIAGNOSIS — Z3480 Encounter for supervision of other normal pregnancy, unspecified trimester: Secondary | ICD-10-CM

## 2020-05-26 NOTE — Telephone Encounter (Signed)
Pt calling; 30wks; had multiple irreg ctxs yesterday; today she feels like she is leaking fluid - has gushes now and then.  [phone]  Pt states today she is just having little spurts of clear watery fluid come out - no mucus and is not urine.

## 2020-05-26 NOTE — Telephone Encounter (Signed)
Pt aware to be in [place] to see JEG at 11:20.

## 2020-05-26 NOTE — Progress Notes (Signed)
Routine Prenatal Care Visit  Subjective  Marissa Herman is a 29 y.o. G2P1001 at [redacted]w[redacted]d being seen today for ongoing prenatal care.  She is currently monitored for the following issues for this low-risk pregnancy and has History of abnormal cervical Pap smear; Migraine headache; Gastroesophageal reflux disease; Metrorrhagia; and Supervision of other normal pregnancy, antepartum on their problem list.  ----------------------------------------------------------------------------------- Patient reports a couple hours of cramping yesterday after increased physical activity. She drank water and rested and cramping stopped. This morning she had 3 episodes of vaginal fluid leaking enough to change her underwear. She did not wear a pad and fluid was not going down her leg.   Contractions: Irritability. Vag. Bleeding: None.  Movement: Present. Leaking Fluid admits to.  ----------------------------------------------------------------------------------- The following portions of the patient's history were reviewed and updated as appropriate: allergies, current medications, past family history, past medical history, past social history, past surgical history and problem list. Problem list updated.  Objective  Blood pressure 129/85, pulse 92, weight 188 lb (85.3 kg), last menstrual period 10/30/2019, currently breastfeeding. Pregravid weight 161 lb (73 kg) Total Weight Gain 27 lb (12.2 kg) Urinalysis: Urine Protein    Urine Glucose    Fetal Status: Fetal Heart Rate (bpm): 145   Movement: Present     General:  Alert, oriented and cooperative. Patient is in no acute distress.  Skin: Skin is warm and dry. No rash noted.   Cardiovascular: Normal heart rate noted  Respiratory: Normal respiratory effort, no problems with respiration noted  Abdomen: Soft, gravid, appropriate for gestational age. Pain/Pressure: Present     Pelvic:  sterile speculum exam: cervix closed visually, negative for pooling, nitrazine,  ferning. Normal appearing white vaginal discharge in vault        Extremities: Normal range of motion.     Mental Status: Normal mood and affect. Normal behavior. Normal judgment and thought content.   Assessment   29 y.o. G2P1001 at [redacted]w[redacted]d by  08/05/2020, by Last Menstrual Period presenting for work-in prenatal visit  Plan   Pregnancy#2 Problems (from 10/30/19 to present)    Problem Noted Resolved   Supervision of other normal pregnancy, antepartum 12/17/2019 by Tresea Mall, CNM No   Overview Addendum 05/05/2020  2:09 PM by Mirna Mires, CNM    Clinic [facility] Prenatal Labs  Dating  LMP = 8wk [place] Blood type: O/Positive/-- (03/24 1521)   Genetic Screen 1 Screen:  Screen negative, MSAFP negative Antibody:Negative (03/24 1521)  Anatomic [place]  Rubella: 4.95 (03/24 1521) Varicella: Immune  GTT 28 wk: 60 RPR: Non Reactive (03/24 1521)   Rhogam  not needed HBsAg: Negative (03/24 1521)   Vaccines TDAP:                       Flu Shot: HIV: Non Reactive (03/24 1521)   Baby Food Breast                               GBS:   Contraception  Pap: 2019 neg f/u for abnormal 12/17/19: NIL/neg HPV  CBB     CS/VBAC NA   Support Person Husband Kandee Keen          Previous Version    Recommend increase hydration and take it easy for a couple of days given cramping yesterday   Preterm labor symptoms and general obstetric precautions including but not limited to vaginal bleeding, contractions, leaking of fluid and fetal movement were  reviewed in detail with the patient. Please refer to After Visit Summary for other counseling recommendations.   Return for scheduled prenatal visit.  Tresea Mall, CNM 05/26/2020 12:23 PM

## 2020-05-26 NOTE — Progress Notes (Signed)
ROB  Leaking fluid w/ minor ctx

## 2020-06-04 ENCOUNTER — Other Ambulatory Visit: Payer: Self-pay | Admitting: Obstetrics & Gynecology

## 2020-06-04 ENCOUNTER — Ambulatory Visit (INDEPENDENT_AMBULATORY_CARE_PROVIDER_SITE_OTHER): Payer: BC Managed Care – PPO

## 2020-06-04 ENCOUNTER — Encounter: Payer: Self-pay | Admitting: Obstetrics & Gynecology

## 2020-06-04 ENCOUNTER — Other Ambulatory Visit: Payer: Self-pay

## 2020-06-04 ENCOUNTER — Other Ambulatory Visit: Payer: Self-pay | Admitting: Certified Nurse Midwife

## 2020-06-04 ENCOUNTER — Ambulatory Visit (INDEPENDENT_AMBULATORY_CARE_PROVIDER_SITE_OTHER): Payer: BC Managed Care – PPO | Admitting: Obstetrics & Gynecology

## 2020-06-04 VITALS — BP 120/80 | Wt 194.0 lb

## 2020-06-04 DIAGNOSIS — O4440 Low lying placenta NOS or without hemorrhage, unspecified trimester: Secondary | ICD-10-CM

## 2020-06-04 DIAGNOSIS — Z3A31 31 weeks gestation of pregnancy: Secondary | ICD-10-CM

## 2020-06-04 DIAGNOSIS — Z23 Encounter for immunization: Secondary | ICD-10-CM

## 2020-06-04 DIAGNOSIS — Z3A28 28 weeks gestation of pregnancy: Secondary | ICD-10-CM

## 2020-06-04 DIAGNOSIS — O4443 Low lying placenta NOS or without hemorrhage, third trimester: Secondary | ICD-10-CM

## 2020-06-04 DIAGNOSIS — Z3483 Encounter for supervision of other normal pregnancy, third trimester: Secondary | ICD-10-CM

## 2020-06-04 LAB — POCT URINALYSIS DIPSTICK OB
Glucose, UA: NEGATIVE
POC,PROTEIN,UA: NEGATIVE

## 2020-06-04 NOTE — Patient Instructions (Signed)
Surgery to Prevent Pregnancy Female sterilization is surgery to prevent pregnancy. In this surgery, the fallopian tubes are either blocked or closed off. When the fallopian tubes are closed, the eggs that the ovaries release cannot enter the uterus, sperm cannot reach the eggs, and you cannot get pregnant. Sterilization is permanent. It should only be done if you are sure that you do not want to be able to have children. What are the sterilization surgery options? There are several kinds of female sterilization surgeries. They include:  Laparoscopic tubal ligation. In this surgery, the fallopian tubes are tied off, sealed with heat, or blocked with a clip, ring, or clamp. A small portion of each fallopian tube may also be removed. This surgery is done through several small cuts (incisions) with special instruments that are inserted into your abdomen.  Postpartum tubal ligation. This is also called a mini-laparotomy. This surgery is done right after childbirth or 1 or 2 days after childbirth. In this surgery, the fallopian tubes are tied off, sealed with heat, or blocked with a clip, ring, or clamp. A small portion of each fallopian tube may also be removed. The surgery is done through a single incision in the abdomen.  Tubal ligation during a C-section. In this surgery, the fallopian tubes are tied off, sealed with heat, or blocked with a clip, ring, or clamp. A small portion of each fallopian tube may also be removed. The surgery is done at the same time as a C-section delivery. Is sterilization safe? Generally, sterilization is safe. Complications are rare. However, there are risks. They include:  Bleeding.  Infection.  Reaction to medicine used during the procedure.  Injury to surrounding organs.  Failure of the procedure. How effective is sterilization? Sterilization is nearly 100% effective, but it can fail. In rare cases, the fallopian tubes can grow back together over time. If this  happens, pregnancy may be possible and you will be able to get pregnant again. Women who have had this procedure have a higher chance of having an ectopic pregnancy. An ectopic pregnancy is a pregnancy that happens outside of the uterus. This kind of pregnancy can lead to serious bleeding if it is not treated. What are the benefits?  It is usually effective for a lifetime.  It is usually safe.  It does not have the drawbacks of other types of birth control in that your hormones are not affected. Because of this, your menstrual periods, sexual desire, and sexual performance will not be affected. What are the drawbacks?  You will need to recover and may have complications after surgery.  If you change your mind and decide that you want to have children, you may not be able to. Sterilization may be reversed, but a reversal is not always successful.  It does not provide protection against STDs (sexually transmitted diseases).  It increases the chance of having an ectopic pregnancy. Follow these instructions at home:  Keep all follow-up visits as told by your health care provider. This is important. Summary  Female sterilization is surgery to prevent pregnancy.  There are different types of female sterilization surgeries.  Sterilization may be reversed, but a reversal is not always successful.  Sterilization does not protect against STDs. This information is not intended to replace advice given to you by your health care provider. Make sure you discuss any questions you have with your health care provider. Document Revised: 02/26/2019 Document Reviewed: 05/24/2018 Elsevier Patient Education  2020 [employer].

## 2020-06-04 NOTE — Progress Notes (Signed)
  Subjective  Fetal Movement? yes Contractions? no Leaking Fluid? no Vaginal Bleeding? no  Objective  BP 120/80   Wt 194 lb (88 kg)   LMP 10/30/2019 (Exact Date)   BMI 31.79 kg/m  General: NAD Pumonary: no increased work of breathing Abdomen: gravid, non-tender Extremities: no edema Psychiatric: mood appropriate, affect full  Review of ULTRASOUND.    I have personally reviewed images and report of recent ultrasound done at [facility].    Plan of management to be discussed with patient. Placenta normal location, 7 cm from cervix   Assessment  29 y.o. G2P1001 at [redacted]w[redacted]d by  08/05/2020, by Last Menstrual Period presenting for routine prenatal visit  Plan   Problem List Items Addressed This Visit      Other   Supervision of other normal pregnancy, antepartum    Other Visit Diagnoses    [redacted] weeks gestation of pregnancy    -  Primary   Relevant Orders   POC Urinalysis Dipstick OB (Completed)   Need for Tdap vaccination          Pregnancy#2 Problems (from 10/30/19 to present)    Problem Noted Resolved   Supervision of other normal pregnancy, antepartum 12/17/2019 by Tresea Mall, CNM No   Overview Addendum 06/04/2020 11:40 AM by Nadara Mustard, MD    [facility]inic [facility] Prenatal Labs  Dating  LMP = 8wk [place] Blood type: O/Positive/-- (03/24 1521)   Genetic Screen 1 Screen:  Screen negative, MSAFP negative Antibody:Negative (03/24 1521)  Anatomic [place] [facility] Rubella: 4.95 (03/24 1521) Varicella: Immune  GTT 28 wk: 60 RPR: Non Reactive (03/24 1521)   Rhogam  not needed HBsAg: Negative (03/24 1521)   Vaccines TDAP:                       Flu Shot: HIV: Non Reactive (03/24 1521)   Baby Food Breast                               GBS:   Contraception PP BTL Pap: 2019 neg f/u for abnormal 12/17/19: NIL/neg HPV  CBB  No   CS/VBAC NA   Support Person Husband Kandee Keen          Previous Version     Discussed contraception and PP BTL in detail.  Pt desires permanent birth control.  Breast  feeding plans  PNV, [facility]  TDaP today  Flu shot nv  Annamarie Major, MD, [facility], [facility] 06/04/2020  11:40 AM

## 2020-06-17 ENCOUNTER — Ambulatory Visit (INDEPENDENT_AMBULATORY_CARE_PROVIDER_SITE_OTHER): Payer: BC Managed Care – PPO | Admitting: Obstetrics & Gynecology

## 2020-06-17 ENCOUNTER — Other Ambulatory Visit: Payer: Self-pay

## 2020-06-17 ENCOUNTER — Encounter: Payer: Self-pay | Admitting: Obstetrics & Gynecology

## 2020-06-17 VITALS — BP 120/80 | Wt 201.0 lb

## 2020-06-17 DIAGNOSIS — Z3483 Encounter for supervision of other normal pregnancy, third trimester: Secondary | ICD-10-CM

## 2020-06-17 DIAGNOSIS — Z23 Encounter for immunization: Secondary | ICD-10-CM

## 2020-06-17 DIAGNOSIS — Z3A33 33 weeks gestation of pregnancy: Secondary | ICD-10-CM

## 2020-06-17 DIAGNOSIS — O322XX0 Maternal care for transverse and oblique lie, not applicable or unspecified: Secondary | ICD-10-CM

## 2020-06-17 NOTE — Patient Instructions (Signed)
Third Trimester of Pregnancy The third trimester is from week 28 through week 40 (months 7 through 9). The third trimester is a time when the unborn baby (fetus) is growing rapidly. At the end of the ninth month, the fetus is about 20 inches in length and weighs 6-10 pounds. Body changes during your third trimester Your body will continue to go through many changes during pregnancy. The changes vary from woman to woman. During the third trimester:  Your weight will continue to increase. You can expect to gain 25-35 pounds (11-16 kg) by the end of the pregnancy.  You may begin to get stretch marks on your hips, abdomen, and breasts.  You may urinate more often because the fetus is moving lower into your pelvis and pressing on your bladder.  You may develop or continue to have heartburn. This is caused by increased hormones that slow down muscles in the digestive tract.  You may develop or continue to have constipation because increased hormones slow digestion and cause the muscles that push waste through your intestines to relax.  You may develop hemorrhoids. These are swollen veins (varicose veins) in the rectum that can itch or be painful.  You may develop swollen, bulging veins (varicose veins) in your legs.  You may have increased body aches in the pelvis, back, or thighs. This is due to weight gain and increased hormones that are relaxing your joints.  You may have changes in your hair. These can include thickening of your hair, rapid growth, and changes in texture. Some women also have hair loss during or after pregnancy, or hair that feels dry or thin. Your hair will most likely return to normal after your baby is born.  Your breasts will continue to grow and they will continue to become tender. A yellow fluid (colostrum) may leak from your breasts. This is the first milk you are producing for your baby.  Your belly button may stick out.  You may notice more swelling in your hands,  face, or ankles.  You may have increased tingling or numbness in your hands, arms, and legs. The skin on your belly may also feel numb.  You may feel short of breath because of your expanding uterus.  You may have more problems sleeping. This can be caused by the size of your belly, increased need to urinate, and an increase in your body's metabolism.  You may notice the fetus "dropping," or moving lower in your abdomen (lightening).  You may have increased vaginal discharge.  You may notice your joints feel loose and you may have pain around your pelvic bone. What to expect at prenatal visits You will have prenatal exams every 2 weeks until week 36. Then you will have weekly prenatal exams. During a routine prenatal visit:  You will be weighed to make sure you and the baby are growing normally.  Your blood pressure will be taken.  Your abdomen will be measured to track your baby's growth.  The fetal heartbeat will be listened to.  Any test results from the previous visit will be discussed.  You may have a cervical check near your due date to see if your cervix has softened or thinned (effaced).  You will be tested for Group B streptococcus. This happens between 35 and 37 weeks. Your health care provider may ask you:  What your birth plan is.  How you are feeling.  If you are feeling the baby move.  If you have had any abnormal   symptoms, such as leaking fluid, bleeding, severe headaches, or abdominal cramping.  If you are using any tobacco products, including cigarettes, chewing tobacco, and electronic cigarettes.  If you have any questions. Other tests or screenings that may be performed during your third trimester include:  Blood tests that check for low iron levels (anemia).  Fetal testing to check the health, activity level, and growth of the fetus. Testing is done if you have certain medical conditions or if there are problems during the pregnancy.  Nonstress test  (NST). This test checks the health of your baby to make sure there are no signs of problems, such as the baby not getting enough oxygen. During this test, a belt is placed around your belly. The baby is made to move, and its heart rate is monitored during movement. What is false labor? False labor is a condition in which you feel small, irregular tightenings of the muscles in the womb (contractions) that usually go away with rest, changing position, or drinking water. These are called Braxton Hicks contractions. Contractions may last for hours, days, or even weeks before true labor sets in. If contractions come at regular intervals, become more frequent, increase in intensity, or become painful, you should see your health care provider. What are the signs of labor?  Abdominal cramps.  Regular contractions that start at 10 minutes apart and become stronger and more frequent with time.  Contractions that start on the top of the uterus and spread down to the lower abdomen and back.  Increased pelvic pressure and dull back pain.  A watery or bloody mucus discharge that comes from the vagina.  Leaking of amniotic fluid. This is also known as your "water breaking." It could be a slow trickle or a gush. Let your health care provider know if it has a color or strange odor. If you have any of these signs, call your health care provider right away, even if it is before your due date. Follow these instructions at home: Medicines  Follow your health care provider's instructions regarding medicine use. Specific medicines may be either safe or unsafe to take during pregnancy.  Take a prenatal vitamin that contains at least 600 micrograms (mcg) of folic acid.  If you develop constipation, try taking a stool softener if your health care provider approves. Eating and drinking   Eat a balanced diet that includes fresh fruits and vegetables, whole grains, good sources of protein such as meat, eggs, or tofu,  and low-fat dairy. Your health care provider will help you determine the amount of weight gain that is right for you.  Avoid raw meat and uncooked cheese. These carry germs that can cause birth defects in the baby.  If you have low calcium intake from food, talk to your health care provider about whether you should take a daily calcium supplement.  Eat four or five small meals rather than three large meals a day.  Limit foods that are high in fat and processed sugars, such as fried and sweet foods.  To prevent constipation: ? Drink enough fluid to keep your urine clear or pale yellow. ? Eat foods that are high in fiber, such as fresh fruits and vegetables, whole grains, and beans. Activity  Exercise only as directed by your health care provider. Most women can continue their usual exercise routine during pregnancy. Try to exercise for 30 minutes at least 5 days a week. Stop exercising if you experience uterine contractions.  Avoid heavy lifting.  Do   not exercise in extreme heat or humidity, or at high altitudes.  Wear low-heel, comfortable shoes.  Practice good posture.  You may continue to have sex unless your health care provider tells you otherwise. Relieving pain and discomfort  Take frequent breaks and rest with your legs elevated if you have leg cramps or low back pain.  Take warm sitz baths to soothe any pain or discomfort caused by hemorrhoids. Use hemorrhoid cream if your health care provider approves.  Wear a good support bra to prevent discomfort from breast tenderness.  If you develop varicose veins: ? Wear support pantyhose or compression stockings as told by your healthcare provider. ? Elevate your feet for 15 minutes, 3-4 times a day. Prenatal care  Write down your questions. Take them to your prenatal visits.  Keep all your prenatal visits as told by your health care provider. This is important. Safety  Wear your seat belt at all times when driving.  Make  a list of emergency phone numbers, including numbers for family, friends, the hospital, and police and fire departments. General instructions  Avoid cat litter boxes and soil used by cats. These carry germs that can cause birth defects in the baby. If you have a cat, ask someone to clean the litter box for you.  Do not travel far distances unless it is absolutely necessary and only with the approval of your health care provider.  Do not use hot tubs, steam rooms, or saunas.  Do not drink alcohol.  Do not use any products that contain nicotine or tobacco, such as cigarettes and e-cigarettes. If you need help quitting, ask your health care provider.  Do not use any medicinal herbs or unprescribed drugs. These chemicals affect the formation and growth of the baby.  Do not douche or use tampons or scented sanitary pads.  Do not cross your legs for long periods of time.  To prepare for the arrival of your baby: ? Take prenatal classes to understand, practice, and ask questions about labor and delivery. ? Make a trial run to the hospital. ? Visit the hospital and tour the maternity area. ? Arrange for maternity or paternity leave through employers. ? Arrange for family and friends to take care of pets while you are in the hospital. ? Purchase a rear-facing car seat and make sure you know how to install it in your car. ? Pack your hospital bag. ? Prepare the baby's nursery. Make sure to remove all pillows and stuffed animals from the baby's crib to prevent suffocation.  Visit your dentist if you have not gone during your pregnancy. Use a soft toothbrush to brush your teeth and be gentle when you floss. Contact a health care provider if:  You are unsure if you are in labor or if your water has broken.  You become dizzy.  You have mild pelvic cramps, pelvic pressure, or nagging pain in your abdominal area.  You have lower back pain.  You have persistent nausea, vomiting, or  diarrhea.  You have an unusual or bad smelling vaginal discharge.  You have pain when you urinate. Get help right away if:  Your water breaks before 37 weeks.  You have regular contractions less than 5 minutes apart before 37 weeks.  You have a fever.  You are leaking fluid from your vagina.  You have spotting or bleeding from your vagina.  You have severe abdominal pain or cramping.  You have rapid weight loss or weight gain.  You have   shortness of breath with chest pain.  You notice sudden or extreme swelling of your face, hands, ankles, feet, or legs.  Your baby makes fewer than 10 movements in 2 hours.  You have severe headaches that do not go away when you take medicine.  You have vision changes. Summary  The third trimester is from week 28 through week 40, months 7 through 9. The third trimester is a time when the unborn baby (fetus) is growing rapidly.  During the third trimester, your discomfort may increase as you and your baby continue to gain weight. You may have abdominal, leg, and back pain, sleeping problems, and an increased need to urinate.  During the third trimester your breasts will keep growing and they will continue to become tender. A yellow fluid (colostrum) may leak from your breasts. This is the first milk you are producing for your baby.  False labor is a condition in which you feel small, irregular tightenings of the muscles in the womb (contractions) that eventually go away. These are called Braxton Hicks contractions. Contractions may last for hours, days, or even weeks before true labor sets in.  Signs of labor can include: abdominal cramps; regular contractions that start at 10 minutes apart and become stronger and more frequent with time; watery or bloody mucus discharge that comes from the vagina; increased pelvic pressure and dull back pain; and leaking of amniotic fluid. This information is not intended to replace advice given to you by your  health care provider. Make sure you discuss any questions you have with your health care provider. Document Revised: 01/02/2019 Document Reviewed: 10/17/2016 Elsevier Patient Education  2020 [employer]  

## 2020-06-17 NOTE — Progress Notes (Signed)
  Subjective  Fetal Movement? yes Contractions? no Leaking Fluid? no Vaginal Bleeding? no  Objective  BP 120/80   Wt 201 lb (91.2 kg)   LMP 10/30/2019 (Exact Date)   BMI 32.94 kg/m  General: NAD Pumonary: no increased work of breathing Abdomen: gravid, non-tender Extremities: no edema Psychiatric: mood appropriate, affect full  Assessment  29 y.o. G2P1001 at [redacted]w[redacted]d by  08/05/2020, by Last Menstrual Period presenting for routine prenatal visit  Plan   Problem List Items Addressed This Visit    None    Visit Diagnoses    Need for immunization against influenza    -  Primary   Relevant Orders   Flu Vaccine QUAD 36+ mos IM (Completed)   [redacted] weeks gestation of pregnancy       Encounter for supervision of other normal pregnancy in third trimester       Transverse lie of fetus, single or unspecified fetus       Relevant Orders   US OB Limited      Pregnancy#2 Problems (from 10/30/19 to present)    Problem Noted Resolved   Supervision of other normal pregnancy, antepartum 12/17/2019 by Tresea Mall, CNM No   Overview Addendum 06/04/2020 11:40 AM by Nadara Mustard, MD    [facility]inic [facility] Prenatal Labs  Dating  LMP = 8wk [place] Blood type: O/Positive/-- (03/24 1521)   Genetic Screen 1 Screen:  Screen negative, MSAFP negative Antibody:Negative (03/24 1521)  Anatomic [place] [facility] Rubella: 4.95 (03/24 1521) Varicella: Immune  GTT 28 wk: 60 RPR: Non Reactive (03/24 1521)   Rhogam  not needed HBsAg: Negative (03/24 1521)   Vaccines TDAP:                       Flu Shot: HIV: Non Reactive (03/24 1521)   Baby Food Breast                               GBS:   Contraception PP BTL Pap: 2019 neg f/u for abnormal 12/17/19: NIL/neg HPV  CBB  No   CS/VBAC NA   Support Person Husband Lazarus Salines, MD, [facility], [facility] 06/17/2020  4:15 PM

## 2020-07-02 ENCOUNTER — Ambulatory Visit (INDEPENDENT_AMBULATORY_CARE_PROVIDER_SITE_OTHER): Payer: BC Managed Care – PPO | Admitting: Obstetrics

## 2020-07-02 ENCOUNTER — Ambulatory Visit (INDEPENDENT_AMBULATORY_CARE_PROVIDER_SITE_OTHER): Payer: BC Managed Care – PPO

## 2020-07-02 ENCOUNTER — Other Ambulatory Visit: Payer: Self-pay

## 2020-07-02 VITALS — BP 116/80 | Wt 199.0 lb

## 2020-07-02 DIAGNOSIS — Z3A35 35 weeks gestation of pregnancy: Secondary | ICD-10-CM

## 2020-07-02 DIAGNOSIS — Z3483 Encounter for supervision of other normal pregnancy, third trimester: Secondary | ICD-10-CM

## 2020-07-02 DIAGNOSIS — O322XX0 Maternal care for transverse and oblique lie, not applicable or unspecified: Secondary | ICD-10-CM | POA: Diagnosis not present

## 2020-07-02 LAB — POCT URINALYSIS DIPSTICK OB
Glucose, UA: NEGATIVE
POC,PROTEIN,UA: NEGATIVE

## 2020-07-02 NOTE — Progress Notes (Signed)
  Routine Prenatal Care Visit  Subjective  Marissa Herman is a 29 y.o. G2P1001 at 100w1d being seen today for ongoing prenatal care.  She is currently monitored for the following issues for this low-risk pregnancy and has History of abnormal cervical Pap smear; Migraine headache; Gastroesophageal reflux disease; Metrorrhagia; and Supervision of other normal pregnancy, antepartum on their problem list.  ----------------------------------------------------------------------------------- Patient reports good fetal movement. Ultrasound today shows baby is vertex.   Contractions: Not present. Vag. Bleeding: None.  Movement: Present. Leaking Fluid denies.  ----------------------------------------------------------------------------------- The following portions of the patient's history were reviewed and updated as appropriate: allergies, current medications, past family history, past medical history, past social history, past surgical history and problem list. Problem list updated.  Objective  Blood pressure 116/80, weight 199 lb (90.3 kg), last menstrual period 10/30/2019, currently breastfeeding. Pregravid weight 161 lb (73 kg) Total Weight Gain 38 lb (17.2 kg) Urinalysis: Urine Protein Negative  Urine Glucose Negative  Fetal Status: Fetal Heart Rate (bpm): RNST Fundal Height: 35 cm Movement: Present     General:  Alert, oriented and cooperative. Patient is in no acute distress.  Skin: Skin is warm and dry. No rash noted.   Cardiovascular: Normal heart rate noted  Respiratory: Normal respiratory effort, no problems with respiration noted  Abdomen: Soft, gravid, appropriate for gestational age. Pain/Pressure: Absent     Pelvic:  Cervical exam performed      closed, thick and high.  Extremities: Normal range of motion.  Edema: None  Mental Status: Normal mood and affect. Normal behavior. Normal judgment and thought content.   Assessment   29 y.o. G2P1001 at [redacted]w[redacted]d by  08/05/2020, by Last  Menstrual Period presenting for routine prenatal visit  Plan   Pregnancy#2 Problems (from 10/30/19 to present)    Problem Noted Resolved   Supervision of other normal pregnancy, antepartum 12/17/2019 by Tresea Mall, CNM No   Overview Addendum 06/04/2020 11:40 AM by Nadara Mustard, MD    Clinic [facility] Prenatal Labs  Dating  LMP = 8wk [place] Blood type: O/Positive/-- (03/24 1521)   Genetic Screen 1 Screen:  Screen negative, MSAFP negative Antibody:Negative (03/24 1521)  Anatomic [place] [facility] Rubella: 4.95 (03/24 1521) Varicella: Immune  GTT 28 wk: 60 RPR: Non Reactive (03/24 1521)   Rhogam  not needed HBsAg: Negative (03/24 1521)   Vaccines TDAP:                       Flu Shot: HIV: Non Reactive (03/24 1521)   Baby Food Breast                               GBS:   Contraception PP BTL Pap: 2019 neg f/u for abnormal 12/17/19: NIL/neg HPV  CBB  No   CS/VBAC NA   Support Person Husband Kandee Keen          Previous Version       Preterm labor symptoms and general obstetric precautions including but not limited to vaginal bleeding, contractions, leaking of fluid and fetal movement were reviewed in detail with the patient. Please refer to After Visit Summary for other counseling recommendations.  GBS culture retrieved. NST as FHTs initially higher today- baseline 155-160 with accels and no decels. Reactive.  Return in about 1 week (around 07/09/2020) for return OB.  Mirna Mires, CNM  07/02/2020 11:55 AM

## 2020-07-02 NOTE — Progress Notes (Signed)
C/o tiredness.rj

## 2020-07-06 LAB — CULTURE, BETA STREP (GROUP B ONLY): Strep Gp B Culture: NEGATIVE

## 2020-07-09 ENCOUNTER — Ambulatory Visit (INDEPENDENT_AMBULATORY_CARE_PROVIDER_SITE_OTHER): Payer: BC Managed Care – PPO | Admitting: Advanced Practice Midwife

## 2020-07-09 ENCOUNTER — Encounter: Payer: Self-pay | Admitting: Advanced Practice Midwife

## 2020-07-09 ENCOUNTER — Other Ambulatory Visit: Payer: Self-pay

## 2020-07-09 ENCOUNTER — Other Ambulatory Visit: Payer: Self-pay | Admitting: Advanced Practice Midwife

## 2020-07-09 VITALS — BP 122/80 | Wt 201.0 lb

## 2020-07-09 DIAGNOSIS — Z3A36 36 weeks gestation of pregnancy: Secondary | ICD-10-CM

## 2020-07-09 DIAGNOSIS — O26893 Other specified pregnancy related conditions, third trimester: Secondary | ICD-10-CM

## 2020-07-09 DIAGNOSIS — Z3480 Encounter for supervision of other normal pregnancy, unspecified trimester: Secondary | ICD-10-CM

## 2020-07-09 LAB — POCT URINALYSIS DIPSTICK OB
Glucose, UA: NEGATIVE
POC,PROTEIN,UA: NEGATIVE

## 2020-07-09 MED ORDER — OMEPRAZOLE 20 MG PO CPDR: 20.0000 mg | DELAYED_RELEASE_CAPSULE | Freq: Every day | ORAL | 11 refills | Status: DC

## 2020-07-09 MED ORDER — OMEPRAZOLE 20 MG PO CPDR: 20.0000 mg | DELAYED_RELEASE_CAPSULE | Freq: Every day | ORAL | 11 refills | Status: AC

## 2020-07-09 NOTE — Progress Notes (Signed)
Rx prilosec faxed to pharmacy. E Rx not working.

## 2020-07-09 NOTE — Progress Notes (Signed)
  Routine Prenatal Care Visit  Subjective  Marissa Herman is a 29 y.o. G2P1001 at [redacted]w[redacted]d being seen today for ongoing prenatal care.  She is currently monitored for the following issues for this low-risk pregnancy and has History of abnormal cervical Pap smear; Migraine headache; Gastroesophageal reflux disease; Metrorrhagia; and Supervision of other normal pregnancy, antepartum on their problem list.  ----------------------------------------------------------------------------------- Patient reports irregular contractions 3 or 4 per day and an increase in pelvic pressure.   Contractions: Irregular. Vag. Bleeding: None.  Movement: Present. Leaking Fluid denies.  ----------------------------------------------------------------------------------- The following portions of the patient's history were reviewed and updated as appropriate: allergies, current medications, past family history, past medical history, past social history, past surgical history and problem list. Problem list updated.  Objective  Blood pressure 122/80, weight 201 lb (91.2 kg), last menstrual period 10/30/2019, currently breastfeeding. Pregravid weight 161 lb (73 kg) Total Weight Gain 40 lb (18.1 kg) Urinalysis: Urine Protein    Urine Glucose    Fetal Status: Fetal Heart Rate (bpm): 148 Fundal Height: 36 cm Movement: Present     General:  Alert, oriented and cooperative. Patient is in no acute distress.  Skin: Skin is warm and dry. No rash noted.   Cardiovascular: Normal heart rate noted  Respiratory: Normal respiratory effort, no problems with respiration noted  Abdomen: Soft, gravid, appropriate for gestational age. Pain/Pressure: Present     Pelvic:  Cervical exam deferred        Extremities: Normal range of motion.  Edema: None  Mental Status: Normal mood and affect. Normal behavior. Normal judgment and thought content.   Assessment   29 y.o. G2P1001 at [redacted]w[redacted]d by  08/05/2020, by Last Menstrual Period presenting for  routine prenatal visit  Plan   Pregnancy#2 Problems (from 10/30/19 to present)    Problem Noted Resolved   Supervision of other normal pregnancy, antepartum 12/17/2019 by Tresea Mall, CNM No   Overview Addendum 06/04/2020 11:40 AM by Nadara Mustard, MD    [facility]inic [facility] Prenatal Labs  Dating  LMP = 8wk [place] Blood type: O/Positive/-- (03/24 1521)   Genetic Screen 1 Screen:  Screen negative, MSAFP negative Antibody:Negative (03/24 1521)  Anatomic [place] [facility] Rubella: 4.95 (03/24 1521) Varicella: Immune  GTT 28 wk: 60 RPR: Non Reactive (03/24 1521)   Rhogam  not needed HBsAg: Negative (03/24 1521)   Vaccines TDAP:                       Flu Shot: HIV: Non Reactive (03/24 1521)   Baby Food Breast                               GBS:   Contraception PP BTL Pap: 2019 neg f/u for abnormal 12/17/19: NIL/neg HPV  CBB  No   CS/VBAC NA   Support Person Husband Kandee Keen          Previous Version       Preterm labor symptoms and general obstetric precautions including but not limited to vaginal bleeding, contractions, leaking of fluid and fetal movement were reviewed in detail with the patient.    Return in about 1 week (around 07/16/2020) for rob.  Tresea Mall, CNM 07/09/2020 10:22 AM

## 2020-07-16 ENCOUNTER — Encounter: Payer: Self-pay | Admitting: Obstetrics & Gynecology

## 2020-07-16 ENCOUNTER — Ambulatory Visit (INDEPENDENT_AMBULATORY_CARE_PROVIDER_SITE_OTHER): Payer: BC Managed Care – PPO | Admitting: Obstetrics & Gynecology

## 2020-07-16 ENCOUNTER — Other Ambulatory Visit: Payer: Self-pay

## 2020-07-16 VITALS — BP 120/78 | Wt 206.0 lb

## 2020-07-16 DIAGNOSIS — Z3483 Encounter for supervision of other normal pregnancy, third trimester: Secondary | ICD-10-CM

## 2020-07-16 DIAGNOSIS — Z3A37 37 weeks gestation of pregnancy: Secondary | ICD-10-CM

## 2020-07-16 LAB — POCT URINALYSIS DIPSTICK OB
Glucose, UA: NEGATIVE
POC,PROTEIN,UA: NEGATIVE

## 2020-07-16 NOTE — Progress Notes (Signed)
  Subjective  Fetal Movement? yes Contractions? BHs Leaking Fluid? no Vaginal Bleeding? no  Objective  BP 120/78   Wt 206 lb (93.4 kg)   LMP 10/30/2019 (Exact Date)   BMI 33.76 kg/m  General: NAD Pumonary: no increased work of breathing Abdomen: gravid, non-tender Extremities: no edema Psychiatric: mood appropriate, affect full SVE: 0/30/-3 Assessment  29 y.o. G2P1001 at [redacted]w[redacted]d by  08/05/2020, by Last Menstrual Period presenting for routine prenatal visit  Plan   Problem List Items Addressed This Visit      Other   Supervision of other normal pregnancy, antepartum    Other Visit Diagnoses    [redacted] weeks gestation of pregnancy    -  Primary   Relevant Orders   POC Urinalysis Dipstick OB (Completed)      Pregnancy#2 Problems (from 10/30/19 to present)    Problem Noted Resolved   Supervision of other normal pregnancy, antepartum     Overview Addendum 06/04/2020 11:40 AM by Nadara Mustard, MD    [facility]inic [facility] Prenatal Labs  Dating  LMP = 8wk [place] Blood type: O/Positive/-- (03/24 1521)   Genetic Screen 1 Screen:  Screen negative, MSAFP negative Antibody:Negative (03/24 1521)  Anatomic [place] [facility] Rubella: 4.95 (03/24 1521) Varicella: Immune  GTT 28 wk: 60 RPR: Non Reactive (03/24 1521)   Rhogam  not needed HBsAg: Negative (03/24 1521)   Vaccines TDAP:       UTD                Flu Shot:   UTD HIV: Non Reactive (03/24 1521)   Baby Food Breast                               GBS: NEG  Contraception PP BTL Pap: 2019 neg f/u for abnormal 12/17/19: NIL/neg HPV  CBB  No   CS/VBAC NA   Support Person Husband Lazarus Salines, MD, [facility], [facility] 07/16/2020  2:12 PM

## 2020-07-16 NOTE — Patient Instructions (Signed)
Thank you for choosing [facility]. As part of our ongoing efforts to improve patient experience, we would appreciate your feedback. Please fill out the short survey that you will receive by mail or MyChart. Your opinion is important to us! -Dr Claudia Greenley  Braxton Hicks Contractions Contractions of the uterus can occur throughout pregnancy, but they are not always a sign that you are in labor. You may have practice contractions called Braxton Hicks contractions. These false labor contractions are sometimes confused with true labor. What are Braxton Hicks contractions? Braxton Hicks contractions are tightening movements that occur in the muscles of the uterus before labor. Unlike true labor contractions, these contractions do not result in opening (dilation) and thinning of the cervix. Toward the end of pregnancy (32-34 weeks), Braxton Hicks contractions can happen more often and may become stronger. These contractions are sometimes difficult to tell apart from true labor because they can be very uncomfortable. You should not feel embarrassed if you go to the hospital with false labor. Sometimes, the only way to tell if you are in true labor is for your health care provider to look for changes in the cervix. The health care provider will do a physical exam and may monitor your contractions. If you are not in true labor, the exam should show that your cervix is not dilating and your water has not broken. If there are no other health problems associated with your pregnancy, it is completely safe for you to be sent home with false labor. You may continue to have Braxton Hicks contractions until you go into true labor. How to tell the difference between true labor and false labor True labor  Contractions last 30-70 seconds.  Contractions become very regular.  Discomfort is usually felt in the top of the uterus, and it spreads to the lower abdomen and low back.  Contractions do not go away with  walking.  Contractions usually become more intense and increase in frequency.  The cervix dilates and gets thinner. False labor  Contractions are usually shorter and not as strong as true labor contractions.  Contractions are usually irregular.  Contractions are often felt in the front of the lower abdomen and in the groin.  Contractions may go away when you walk around or change positions while lying down.  Contractions get weaker and are shorter-lasting as time goes on.  The cervix usually does not dilate or become thin. Follow these instructions at home:   Take over-the-counter and prescription medicines only as told by your health care provider.  Keep up with your usual exercises and follow other instructions from your health care provider.  Eat and drink lightly if you think you are going into labor.  If Braxton Hicks contractions are making you uncomfortable: ? Change your position from lying down or resting to walking, or change from walking to resting. ? Sit and rest in a tub of warm water. ? Drink enough fluid to keep your urine pale yellow. Dehydration may cause these contractions. ? Do slow and deep breathing several times an hour.  Keep all follow-up prenatal visits as told by your health care provider. This is important. Contact a health care provider if:  You have a fever.  You have continuous pain in your abdomen. Get help right away if:  Your contractions become stronger, more regular, and closer together.  You have fluid leaking or gushing from your vagina.  You pass blood-tinged mucus (bloody show).  You have bleeding from your vagina.    You have low back pain that you never had before.  You feel your baby's head pushing down and causing pelvic pressure.  Your baby is not moving inside you as much as it used to. Summary  Contractions that occur before labor are called Braxton Hicks contractions, false labor, or practice contractions.  Braxton  Hicks contractions are usually shorter, weaker, farther apart, and less regular than true labor contractions. True labor contractions usually become progressively stronger and regular, and they become more frequent.  Manage discomfort from Braxton Hicks contractions by changing position, resting in a warm bath, drinking plenty of water, or practicing deep breathing. This information is not intended to replace advice given to you by your health care provider. Make sure you discuss any questions you have with your health care provider. Document Revised: 08/24/2017 Document Reviewed: 01/25/2017 Elsevier Patient Education  2020 [employer]  

## 2020-07-22 ENCOUNTER — Encounter: Payer: Self-pay | Admitting: Advanced Practice Midwife

## 2020-07-22 ENCOUNTER — Ambulatory Visit (INDEPENDENT_AMBULATORY_CARE_PROVIDER_SITE_OTHER): Payer: BC Managed Care – PPO | Admitting: Advanced Practice Midwife

## 2020-07-22 ENCOUNTER — Other Ambulatory Visit: Payer: Self-pay

## 2020-07-22 VITALS — BP 130/83 | Wt 207.0 lb

## 2020-07-22 DIAGNOSIS — Z3A38 38 weeks gestation of pregnancy: Secondary | ICD-10-CM

## 2020-07-22 DIAGNOSIS — Z3483 Encounter for supervision of other normal pregnancy, third trimester: Secondary | ICD-10-CM

## 2020-07-22 LAB — POCT URINALYSIS DIPSTICK OB
Glucose, UA: NEGATIVE
POC,PROTEIN,UA: NEGATIVE

## 2020-07-22 NOTE — Progress Notes (Signed)
  Routine Prenatal Care Visit  Subjective  Marissa Herman is a 29 y.o. G2P1001 at [redacted]w[redacted]d being seen today for ongoing prenatal care.  She is currently monitored for the following issues for this low-risk pregnancy and has History of abnormal cervical Pap smear; Migraine headache; Gastroesophageal reflux disease; Metrorrhagia; and Supervision of other normal pregnancy, antepartum on their problem list.  ----------------------------------------------------------------------------------- Patient reports discomforts of late pregnancy.  We discussed delivery timing- she prefers IOL by 41 weeks. Contractions: Not present. Vag. Bleeding: None.  Movement: Present. Leaking Fluid denies.  ----------------------------------------------------------------------------------- The following portions of the patient's history were reviewed and updated as appropriate: allergies, current medications, past family history, past medical history, past social history, past surgical history and problem list. Problem list updated.  Objective  Blood pressure 130/83, weight 207 lb (93.9 kg), last menstrual period 10/30/2019, currently breastfeeding. Pregravid weight 161 lb (73 kg) Total Weight Gain 46 lb (20.9 kg) Urinalysis: Urine Protein Negative  Urine Glucose Negative  Fetal Status: Fetal Heart Rate (bpm): 139 Fundal Height: 37 cm Movement: Present     General:  Alert, oriented and cooperative. Patient is in no acute distress.  Skin: Skin is warm and dry. No rash noted.   Cardiovascular: Normal heart rate noted  Respiratory: Normal respiratory effort, no problems with respiration noted  Abdomen: Soft, gravid, appropriate for gestational age. Pain/Pressure: Present     Pelvic:  Cervical exam deferred        Extremities: Normal range of motion.  Edema: None  Mental Status: Normal mood and affect. Normal behavior. Normal judgment and thought content.   Assessment   29 y.o. G2P1001 at [redacted]w[redacted]d by  08/05/2020, by Last  Menstrual Period presenting for routine prenatal visit  Plan   Pregnancy#2 Problems (from 10/30/19 to present)    Problem Noted Resolved   Supervision of other normal pregnancy, antepartum 12/17/2019 by Tresea Mall, CNM No   Overview Addendum 07/16/2020  2:14 PM by Nadara Mustard, MD    [facility]inic [facility] Prenatal Labs  Dating  LMP = 8wk [place] Blood type: O/Positive/-- (03/24 1521)   Genetic Screen 1 Screen:  Screen negative, MSAFP negative Antibody:Negative (03/24 1521)  Anatomic [place] [facility] Rubella: 4.95 (03/24 1521) Varicella: Immune  GTT 28 wk: 60 RPR: Non Reactive (03/24 1521)   Rhogam  not needed HBsAg: Negative (03/24 1521)   Vaccines TDAP:    9/10                   Flu Shot: 9/23 HIV: Non Reactive (03/24 1521)   Baby Food Breast                               PNT:[id number]/-- (10/08 1617)  Contraception PP BTL Pap: 2019 neg f/u for abnormal 12/17/19: NIL/neg HPV  CBB  No   CS/VBAC NA   Support Person Husband Kandee Keen          Previous Version       Preterm labor symptoms and general obstetric precautions including but not limited to vaginal bleeding, contractions, leaking of fluid and fetal movement were reviewed in detail with the patient.   Return in about 1 week (around 07/29/2020) for rob.  Tresea Mall, CNM 07/22/2020 8:32 AM

## 2020-07-22 NOTE — Progress Notes (Signed)
No vb, no lof, pressure in abdominal area from baby.

## 2020-07-23 ENCOUNTER — Encounter: Payer: BC Managed Care – PPO | Admitting: Obstetrics & Gynecology

## 2020-07-29 ENCOUNTER — Other Ambulatory Visit: Payer: Self-pay

## 2020-07-29 ENCOUNTER — Ambulatory Visit (INDEPENDENT_AMBULATORY_CARE_PROVIDER_SITE_OTHER): Payer: BC Managed Care – PPO | Admitting: Advanced Practice Midwife

## 2020-07-29 ENCOUNTER — Encounter: Payer: Self-pay | Admitting: Advanced Practice Midwife

## 2020-07-29 VITALS — BP 132/88 | Wt 210.0 lb

## 2020-07-29 DIAGNOSIS — Z3483 Encounter for supervision of other normal pregnancy, third trimester: Secondary | ICD-10-CM

## 2020-07-29 DIAGNOSIS — Z3A39 39 weeks gestation of pregnancy: Secondary | ICD-10-CM

## 2020-07-29 LAB — POCT URINALYSIS DIPSTICK OB
Glucose, UA: NEGATIVE
POC,PROTEIN,UA: NEGATIVE

## 2020-07-29 NOTE — Progress Notes (Signed)
  Routine Prenatal Care Visit  Subjective  Marissa Herman is a 29 y.o. G2P1001 at [redacted]w[redacted]d being seen today for ongoing prenatal care.  She is currently monitored for the following issues for this low-risk pregnancy and has History of abnormal cervical Pap smear; Migraine headache; Gastroesophageal reflux disease; Metrorrhagia; and Supervision of other normal pregnancy, antepartum on their problem list.  ----------------------------------------------------------------------------------- Patient reports some back pain.   Contractions: Not present. Vag. Bleeding: None.  Movement: Present. Leaking Fluid denies.  ----------------------------------------------------------------------------------- The following portions of the patient's history were reviewed and updated as appropriate: allergies, current medications, past family history, past medical history, past social history, past surgical history and problem list. Problem list updated.  Objective  Blood pressure 132/88, weight 210 lb (95.3 kg), last menstrual period 10/30/2019, currently breastfeeding. Pregravid weight 161 lb (73 kg) Total Weight Gain 49 lb (22.2 kg) Urinalysis: Urine Protein Negative  Urine Glucose Negative  Fetal Status: Fetal Heart Rate (bpm): 153 Fundal Height: 38 cm Movement: Present  Presentation: Vertex  General:  Alert, oriented and cooperative. Patient is in no acute distress.  Skin: Skin is warm and dry. No rash noted.   Cardiovascular: Normal heart rate noted  Respiratory: Normal respiratory effort, no problems with respiration noted  Abdomen: Soft, gravid, appropriate for gestational age. Pain/Pressure: Present     Pelvic:  Cervical exam performed Dilation: Closed Effacement (%): 60 Station: -2, external os is open 3 cm, sweep done  Extremities: Normal range of motion.  Edema: None  Mental Status: Normal mood and affect. Normal behavior. Normal judgment and thought content.   Assessment   29 y.o. G2P1001 at [redacted]w[redacted]d  by  08/05/2020, by Last Menstrual Period presenting for routine prenatal visit  Plan   Pregnancy#2 Problems (from 10/30/19 to present)    Problem Noted Resolved   Supervision of other normal pregnancy, antepartum 12/17/2019 by Tresea Mall, CNM No   Overview Addendum 07/16/2020  2:14 PM by Nadara Mustard, MD    Clinic [facility] Prenatal Labs  Dating  LMP = 8wk [place] Blood type: O/Positive/-- (03/24 1521)   Genetic Screen 1 Screen:  Screen negative, MSAFP negative Antibody:Negative (03/24 1521)  Anatomic [place] [facility] Rubella: 4.95 (03/24 1521) Varicella: Immune  GTT 28 wk: 60 RPR: Non Reactive (03/24 1521)   Rhogam  not needed HBsAg: Negative (03/24 1521)   Vaccines TDAP:    9/10                   Flu Shot: 9/23 HIV: Non Reactive (03/24 1521)   Baby Food Breast                               YQI:[id number]/-- (10/08 1617)  Contraception PP BTL Pap: 2019 neg f/u for abnormal 12/17/19: NIL/neg HPV  CBB  No   CS/VBAC NA   Support Person Husband Kandee Keen          Previous Version       Term labor symptoms and general obstetric precautions including but not limited to vaginal bleeding, contractions, leaking of fluid and fetal movement were reviewed in detail with the patient.   Return in about 1 week (around 08/05/2020) for rob.  Tresea Mall, CNM 07/29/2020 8:35 AM

## 2020-07-30 ENCOUNTER — Telehealth: Payer: Self-pay

## 2020-07-30 ENCOUNTER — Other Ambulatory Visit: Payer: Self-pay

## 2020-07-30 ENCOUNTER — Observation Stay
Admission: EM | Admit: 2020-07-30 | Discharge: 2020-07-30 | Disposition: A | Discharge status: Home / Self Care | Payer: BC Managed Care – PPO | Attending: Obstetrics and Gynecology | Admitting: Obstetrics and Gynecology

## 2020-07-30 ENCOUNTER — Encounter: Payer: Self-pay | Admitting: Obstetrics and Gynecology

## 2020-07-30 DIAGNOSIS — Z3480 Encounter for supervision of other normal pregnancy, unspecified trimester: Secondary | ICD-10-CM

## 2020-07-30 DIAGNOSIS — Z3A39 39 weeks gestation of pregnancy: Secondary | ICD-10-CM | POA: Diagnosis not present

## 2020-07-30 DIAGNOSIS — O4193X1 Disorder of amniotic fluid and membranes, unspecified, third trimester, fetus 1: Principal | ICD-10-CM | POA: Insufficient documentation

## 2020-07-30 DIAGNOSIS — O418X30 Other specified disorders of amniotic fluid and membranes, third trimester, not applicable or unspecified: Secondary | ICD-10-CM

## 2020-07-30 HISTORY — DX: Other specified health status: Z78.9

## 2020-07-30 LAB — RUPTURE OF MEMBRANE (ROM)PLUS: Rom Plus: NEGATIVE

## 2020-07-30 NOTE — Telephone Encounter (Signed)
Pt calling; 39wks; has had 2 gushes of fluid this am that did not smell like urine; not leaving a puddle but very soaked; no ctxs.  [phone] Per CRS - go to L&D; Pt aware to enter via ED; Harvin Hazel in L&D notified.

## 2020-07-30 NOTE — OB Triage Note (Signed)
Discharge home. Follow up appointment already scheduled. Left floor ambulatory with husband. Elaina Hoops

## 2020-07-30 NOTE — OB Triage Note (Signed)
Pt reports her water broke @ 0430 this am. Clear fluid. Reports + fetal movement. Denies vaginal bleeding. Elaina Hoops

## 2020-07-30 NOTE — Discharge Summary (Signed)
Physician Final Progress Note  Patient ID: Marissa Herman MRN: [id number] DOB/AGE: 25-May-1991 29 y.o.  Admit date: 07/30/2020 Admitting provider: Tresea Mall, CNM Discharge date: 07/30/2020   Admission Diagnoses: water broke  Discharge Diagnoses:  Active Problems:   Labor and delivery, indication for care   [redacted] weeks gestation of pregnancy Membranes intact Reactive NST  History of Present Illness: The patient is a 29 y.o. female G2P1001 at [redacted]w[redacted]d who presents for leaking fluid since 4:30 AM. She reports 2 small gushes and has not worn a pad. She denies regular contractions. She denies vaginal bleeding. She reports good fetal movement.    The patient was admitted for triage, placed on monitors, labs collected, cervical exam. All monitoring and labs are normal and reassuring. ROM plus is negative for rupture of membranes. Cervical exam is unchanged from exam at yesterday's office visit. She is discharged to home with instructions and precautions.  Past Medical History:  Diagnosis Date  . History of abnormal cervical Pap smear 09/10/2016   LGSIL/ biopsy +HPV changes  . Medical history non-contributory     Past Surgical History:  Procedure Laterality Date  . COLPOSCOPY  11/10/2016   [facility]IL  . WISDOM TOOTH EXTRACTION      No current facility-administered medications on file prior to encounter.   Current Outpatient Medications on File Prior to Encounter  Medication Sig Dispense Refill  . calcium carbonate (TUMS - DOSED IN MG ELEMENTAL CALCIUM) 500 MG chewable tablet Chew 1 tablet by mouth as needed for indigestion or heartburn.    . docusate sodium (COLACE) 100 MG capsule Take 1 capsule by mouth in the morning, at noon, and at bedtime.    Marland Kitchen omeprazole (PRILOSEC) 20 MG capsule Take 1 capsule (20 mg total) by mouth daily. 30 capsule 11  . Prenatal MV-Min-FA-Omega-3 (PRENATAL GUMMIES/DHA & FA PO) Take 1 tablet by mouth.      Allergies  Allergen Reactions  . Other Rash     Nitrile gloves    Social History   Socioeconomic History  . Marital status: Married    Spouse name: Not on file  . Number of children: Not on file  . Years of education: Not on file  . Highest education level: Not on file  Occupational History  . Not on file  Tobacco Use  . Smoking status: Former Smoker    Types: Cigarettes  . Smokeless tobacco: Never Used  . Tobacco comment: 1 pack/week of cigarettes. Quit March 2018  Vaping Use  . Vaping Use: Never used  Substance and Sexual Activity  . Alcohol use: No    Comment: former  . Drug use: No  . Sexual activity: Yes    Partners: Male    Birth control/protection: Surgical  Other Topics Concern  . Not on file  Social History Narrative  . Not on file   Social Determinants of Health   Financial Resource Strain:   . Difficulty of Paying Living Expenses: Not on file  Food Insecurity:   . Worried About Programme researcher, broadcasting/film/video in the Last Year: Not on file  . Ran Out of Food in the Last Year: Not on file  Transportation Needs:   . Lack of Transportation (Medical): Not on file  . Lack of Transportation (Non-Medical): Not on file  Physical Activity:   . Days of Exercise per Week: Not on file  . Minutes of Exercise per Session: Not on file  Stress:   . Feeling of Stress : Not on file  Social Connections:   . Frequency of Communication with Friends and Family: Not on file  . Frequency of Social Gatherings with Friends and Family: Not on file  . Attends Religious Services: Not on file  . Active Member of Clubs or Organizations: Not on file  . Attends Banker Meetings: Not on file  . Marital Status: Not on file  Intimate Partner Violence:   . Fear of Current or Ex-Partner: Not on file  . Emotionally Abused: Not on file  . Physically Abused: Not on file  . Sexually Abused: Not on file    Family History  Problem Relation Age of Onset  . Diabetes Father   . Heart disease Maternal Grandmother   . Hypertension  Maternal Grandmother   . Stroke Maternal Grandmother   . Heart disease Paternal Grandfather        Had heart attack     Review of Systems  Constitutional: Negative for chills and fever.  HENT: Negative for congestion, ear discharge, ear pain, hearing loss, sinus pain and sore throat.   Eyes: Negative for blurred vision and double vision.  Respiratory: Negative for cough, shortness of breath and wheezing.   Cardiovascular: Negative for chest pain, palpitations and leg swelling.  Gastrointestinal: Negative for abdominal pain, blood in stool, constipation, diarrhea, heartburn, melena, nausea and vomiting.  Genitourinary: Negative for dysuria, flank pain, frequency, hematuria and urgency.       Positive for fluid leaking  Musculoskeletal: Negative for back pain, joint pain and myalgias.  Skin: Negative for itching and rash.  Neurological: Negative for dizziness, tingling, tremors, sensory change, speech change, focal weakness, seizures, loss of consciousness, weakness and headaches.  Endo/Heme/Allergies: Negative for environmental allergies. Does not bruise/bleed easily.  Psychiatric/Behavioral: Negative for depression, hallucinations, memory loss, substance abuse and suicidal ideas. The patient is not nervous/anxious and does not have insomnia.      Physical Exam: Ht 5' 5.5" (1.664 m)   Wt 95.3 kg   LMP 10/30/2019 (Exact Date)   BMI 34.41 kg/m   Constitutional: Well nourished, well developed female in no acute distress.  HEENT: normal Skin: Warm and dry.  Respiratory: Normal respiratory effort Abdomen: FHT present Neuro: DTRs 2+, Cranial nerves grossly intact Psych: Alert and Oriented x3. No memory deficits. Normal mood and affect.  MS: normal gait, normal bilateral lower extremity ROM/strength/stability.  Pelvic exam: per RN L. Elks Exam is similar to exam in office yesterday  Toco: irregular mild contractions lasting 30 seconds Fetal well being: 150 bpm, moderate variability,  +accelerations, -decelerations  Consults: None  Significant Findings/ Diagnostic Studies: labs:  Results for JERRICKA, CARVEY (MRN [id number]) as of 07/30/2020 12:16  Ref. Range 07/30/2020 11:01  Rom Plus Unknown NEGATIVE    Procedures: NST  Hospital Course: The patient was admitted to Labor and Delivery Triage for observation.   Discharge Condition: good  Disposition: Discharge disposition: 01-Home or Self Care  Diet: Regular diet  Discharge Activity: Activity as tolerated  Discharge Instructions    Discharge activity:  No Restrictions   Complete by: As directed    Discharge diet:  No restrictions   Complete by: As directed    Fetal Kick Count:  Lie on our left side for one hour after a meal, and count the number of times your baby kicks.  If it is less than 5 times, get up, move around and drink some juice.  Repeat the test 30 minutes later.  If it is still less than 5 kicks in an  hour, notify your doctor.   Complete by: As directed    LABOR:  When conractions begin, you should start to time them from the beginning of one contraction to the beginning  of the next.  When contractions are 5 - 10 minutes apart or less and have been regular for at least an hour, you should call your health care provider.   Complete by: As directed    No sexual activity restrictions   Complete by: As directed    Notify physician for bleeding from the vagina   Complete by: As directed    Notify physician for blurring of vision or spots before the eyes   Complete by: As directed    Notify physician for chills or fever   Complete by: As directed    Notify physician for fainting spells, "black outs" or loss of consciousness   Complete by: As directed    Notify physician for increase in vaginal discharge   Complete by: As directed    Notify physician for leaking of fluid   Complete by: As directed    Notify physician for pain or burning when urinating   Complete by: As directed    Notify physician  for pelvic pressure (sudden increase)   Complete by: As directed    Notify physician for severe or continued nausea or vomiting   Complete by: As directed    Notify physician for sudden gushing of fluid from the vagina (with or without continued leaking)   Complete by: As directed    Notify physician for sudden, constant, or occasional abdominal pain   Complete by: As directed    Notify physician if baby moving less than usual   Complete by: As directed      Allergies as of 07/30/2020      Reactions   Other Rash   Nitrile gloves      Medication List    STOP taking these medications   famotidine 20 MG tablet Commonly known as: PEPCID     TAKE these medications   calcium carbonate 500 MG chewable tablet Commonly known as: TUMS - dosed in mg elemental calcium Chew 1 tablet by mouth as needed for indigestion or heartburn.   Colace 100 MG capsule Generic drug: docusate sodium Take 1 capsule by mouth in the morning, at noon, and at bedtime.   omeprazole 20 MG capsule Commonly known as: PRILOSEC Take 1 capsule (20 mg total) by mouth daily.   PRENATAL GUMMIES/DHA & FA PO Take 1 tablet by mouth.       Follow-up Information    [facility]. Go to.   Specialty: Obstetrics and Gynecology Why: scheduled prenatal appointment Contact information: [address] [place] [address] [phone]              Total time spent taking care of this patient: 15 minutes  Signed: Tresea Mall, CNM  07/30/2020, 12:11 PM

## 2020-08-04 ENCOUNTER — Other Ambulatory Visit: Payer: Self-pay

## 2020-08-04 ENCOUNTER — Ambulatory Visit (INDEPENDENT_AMBULATORY_CARE_PROVIDER_SITE_OTHER): Payer: BC Managed Care – PPO | Admitting: Obstetrics & Gynecology

## 2020-08-04 ENCOUNTER — Encounter: Payer: Self-pay | Admitting: Obstetrics & Gynecology

## 2020-08-04 VITALS — BP 138/80 | Wt 211.0 lb

## 2020-08-04 DIAGNOSIS — Z3483 Encounter for supervision of other normal pregnancy, third trimester: Secondary | ICD-10-CM

## 2020-08-04 DIAGNOSIS — Z3A39 39 weeks gestation of pregnancy: Secondary | ICD-10-CM

## 2020-08-04 LAB — POCT URINALYSIS DIPSTICK OB: Glucose, UA: NEGATIVE

## 2020-08-04 NOTE — Progress Notes (Signed)
History and Physical  Marissa Herman is a 29 y.o. G2P1001 [redacted]w[redacted]d  for Induction of Labor scheduled due to Favorable cervix at term .   See labor record for pregnancy highlights.  No recent pain, bleeding, ruptured membranes, or other signs of progressing labor.  PMHx: She  has a past medical history of History of abnormal cervical Pap smear (09/10/2016) and Medical history non-contributory. Also,  has a past surgical history that includes Colposcopy (11/10/2016) and Wisdom tooth extraction., family history includes Diabetes in her father; Heart disease in her maternal grandmother and paternal grandfather; Hypertension in her maternal grandmother; Stroke in her maternal grandmother.,  reports that she has quit smoking. Her smoking use included cigarettes. She has never used smokeless tobacco. She reports that she does not drink alcohol and does not use drugs. She has a current medication list which includes the following prescription(s): calcium carbonate, docusate sodium, omeprazole, and prenatal mv-min-fa-omega-3. Also, is allergic to other. OB History  Gravida Para Term Preterm AB Living  2 1 1     1   SAB TAB Ectopic Multiple Live Births          1    # Outcome Date GA Lbr Len/2nd Weight Sex Delivery Anes PTL Lv  2 Current           1 Term 11/01/17 [redacted]w[redacted]d  8 lb 11 oz (3.941 kg) M Vag-Spont   LIV  Patient denies any other pertinent gynecologic issues.   Review of Systems  Constitutional: Negative for chills, fever and malaise/fatigue.  HENT: Negative for congestion, sinus pain and sore throat.   Eyes: Negative for blurred vision and pain.  Respiratory: Negative for cough and wheezing.   Cardiovascular: Negative for chest pain and leg swelling.  Gastrointestinal: Negative for abdominal pain, constipation, diarrhea, heartburn, nausea and vomiting.  Genitourinary: Negative for dysuria, frequency, hematuria and urgency.  Musculoskeletal: Negative for back pain, joint pain, myalgias  and neck pain.  Skin: Negative for itching and rash.  Neurological: Negative for dizziness, tremors and weakness.  Endo/Heme/Allergies: Does not bruise/bleed easily.  Psychiatric/Behavioral: Negative for depression. The patient is not nervous/anxious and does not have insomnia.     Objective: BP 138/80   Wt 211 lb (95.7 kg)   LMP 10/30/2019 (Exact Date)   BMI 34.58 kg/m  Physical Exam Constitutional:      General: She is not in acute distress.    Appearance: She is well-developed.  Genitourinary:     Pelvic exam was performed with patient supine.     Vagina and uterus normal.     No vaginal erythema or bleeding.     No cervical motion tenderness, discharge, polyp or nabothian cyst.     Uterus is mobile.     Uterus is not enlarged.     No uterine mass detected.    Uterus is midaxial.     No right or left adnexal mass present.     Right adnexa not tender.     Left adnexa not tender.     Genitourinary Comments: Cx 2/70/-3, soft, post   HENT:     Head: Normocephalic and atraumatic.     Nose: Nose normal.  Abdominal:     General: There is no distension.     Palpations: Abdomen is soft.     Tenderness: There is no abdominal tenderness.  Musculoskeletal:        General: Normal range of motion.  Neurological:     Mental Status: She is alert and  oriented to person, place, and time.     Cranial Nerves: No cranial nerve deficit.  Skin:    General: Skin is warm and dry.  Psychiatric:        Attention and Perception: Attention normal.        Mood and Affect: Mood and affect normal.        Speech: Speech normal.        Behavior: Behavior normal.        Thought Content: Thought content normal.        Judgment: Judgment normal.     Assessment: Term Pregnancy for Induction of Labor due to Favorable cervix at term.  Plan: Patient will undergo induction of labor with cervical ripening agents.     Patient has been fully informed of the pros and cons, risks and benefits of  continued observation with fetal monitoring versus that of induction of labor.   She understands that there are uncommon risks to induction, which include but are not limited to : frequent or prolonged uterine contractions, fetal distress, uterine rupture, and lack of successful induction.  These risks include all methods including Pitocin and Misoprostol and Cervadil.  Patient understands that using Misoprostol for labor induction is an "off label" indication although it has been studied extensively for this purpose and is an accepted method of induction.  She also has been informed of the increased risks for Cesarean with induction and should induction not be successful.  Patient consents to the induction plan of management.  Plans to breast feed Plans bilateral tubal ligation for contraception TDaP UTD GBS NEG  Clinic [facility] Prenatal Labs  Dating  LMP = 8wk US Blood type: O/Positive/-- (03/24 1521)   Genetic Screen 1 Screen:  Screen negative, MSAFP negative Antibody:Negative (03/24 1521)  Anatomic US [facility] Rubella: 4.95 (03/24 1521) Varicella: Immune  GTT 28 wk: 60 RPR: Non Reactive (03/24 1521)   Rhogam  not needed HBsAg: Negative (03/24 1521)   Vaccines TDAP:       UTD                Flu Shot:   UTD HIV: Non Reactive (03/24 1521)   Baby Food Breast                               GBS: NEG  Contraception PP BTL Pap: 2019 neg f/u for abnormal 12/17/19: NIL/neg HPV  CBB  No   CS/VBAC NA   Support Person Husband Cory     Paul Schneider Warchol, MD, FACOG [facility], [facility] 08/04/2020  11:27 AM  

## 2020-08-04 NOTE — H&P (View-Only) (Signed)
History and Physical  Marissa Herman is a 29 y.o. G2P1001 [redacted]w[redacted]d  for Induction of Labor scheduled due to Favorable cervix at term .   See labor record for pregnancy highlights.  No recent pain, bleeding, ruptured membranes, or other signs of progressing labor.  PMHx: She  has a past medical history of History of abnormal cervical Pap smear (09/10/2016) and Medical history non-contributory. Also,  has a past surgical history that includes Colposcopy (11/10/2016) and Wisdom tooth extraction., family history includes Diabetes in her father; Heart disease in her maternal grandmother and paternal grandfather; Hypertension in her maternal grandmother; Stroke in her maternal grandmother.,  reports that she has quit smoking. Her smoking use included cigarettes. She has never used smokeless tobacco. She reports that she does not drink alcohol and does not use drugs. She has a current medication list which includes the following prescription(s): calcium carbonate, docusate sodium, omeprazole, and prenatal mv-min-fa-omega-3. Also, is allergic to other. OB History  Gravida Para Term Preterm AB Living  2 1 1     1   SAB TAB Ectopic Multiple Live Births          1    # Outcome Date GA Lbr Len/2nd Weight Sex Delivery Anes PTL Lv  2 Current           1 Term 11/01/17 [redacted]w[redacted]d  8 lb 11 oz (3.941 kg) M Vag-Spont   LIV  Patient denies any other pertinent gynecologic issues.   Review of Systems  Constitutional: Negative for chills, fever and malaise/fatigue.  HENT: Negative for congestion, sinus pain and sore throat.   Eyes: Negative for blurred vision and pain.  Respiratory: Negative for cough and wheezing.   Cardiovascular: Negative for chest pain and leg swelling.  Gastrointestinal: Negative for abdominal pain, constipation, diarrhea, heartburn, nausea and vomiting.  Genitourinary: Negative for dysuria, frequency, hematuria and urgency.  Musculoskeletal: Negative for back pain, joint pain, myalgias  and neck pain.  Skin: Negative for itching and rash.  Neurological: Negative for dizziness, tremors and weakness.  Endo/Heme/Allergies: Does not bruise/bleed easily.  Psychiatric/Behavioral: Negative for depression. The patient is not nervous/anxious and does not have insomnia.     Objective: BP 138/80   Wt 211 lb (95.7 kg)   LMP 10/30/2019 (Exact Date)   BMI 34.58 kg/m  Physical Exam Constitutional:      General: She is not in acute distress.    Appearance: She is well-developed.  Genitourinary:     Pelvic exam was performed with patient supine.     Vagina and uterus normal.     No vaginal erythema or bleeding.     No cervical motion tenderness, discharge, polyp or nabothian cyst.     Uterus is mobile.     Uterus is not enlarged.     No uterine mass detected.    Uterus is midaxial.     No right or left adnexal mass present.     Right adnexa not tender.     Left adnexa not tender.     Genitourinary Comments: Cx 2/70/-3, soft, post   HENT:     Head: Normocephalic and atraumatic.     Nose: Nose normal.  Abdominal:     General: There is no distension.     Palpations: Abdomen is soft.     Tenderness: There is no abdominal tenderness.  Musculoskeletal:        General: Normal range of motion.  Neurological:     Mental Status: She is alert and  oriented to person, place, and time.     Cranial Nerves: No cranial nerve deficit.  Skin:    General: Skin is warm and dry.  Psychiatric:        Attention and Perception: Attention normal.        Mood and Affect: Mood and affect normal.        Speech: Speech normal.        Behavior: Behavior normal.        Thought Content: Thought content normal.        Judgment: Judgment normal.     Assessment: Term Pregnancy for Induction of Labor due to Favorable cervix at term.  Plan: Patient will undergo induction of labor with cervical ripening agents.     Patient has been fully informed of the pros and cons, risks and benefits of  continued observation with fetal monitoring versus that of induction of labor.   She understands that there are uncommon risks to induction, which include but are not limited to : frequent or prolonged uterine contractions, fetal distress, uterine rupture, and lack of successful induction.  These risks include all methods including Pitocin and Misoprostol and Cervadil.  Patient understands that using Misoprostol for labor induction is an "off label" indication although it has been studied extensively for this purpose and is an accepted method of induction.  She also has been informed of the increased risks for Cesarean with induction and should induction not be successful.  Patient consents to the induction plan of management.  Plans to breast feed Plans bilateral tubal ligation for contraception TDaP UTD GBS NEG  Clinic [facility] Prenatal Labs  Dating  LMP = 8wk [place] Blood type: O/Positive/-- (03/24 1521)   Genetic Screen 1 Screen:  Screen negative, MSAFP negative Antibody:Negative (03/24 1521)  Anatomic [place] [facility] Rubella: 4.95 (03/24 1521) Varicella: Immune  GTT 28 wk: 60 RPR: Non Reactive (03/24 1521)   Rhogam  not needed HBsAg: Negative (03/24 1521)   Vaccines TDAP:       UTD                Flu Shot:   UTD HIV: Non Reactive (03/24 1521)   Baby Food Breast                               GBS: NEG  Contraception PP BTL Pap: 2019 neg f/u for abnormal 12/17/19: NIL/neg HPV  CBB  No   CS/VBAC NA   Support Person Husband Lazarus Salines, MD, [facility], [facility] 08/04/2020  11:27 AM

## 2020-08-04 NOTE — Addendum Note (Signed)
Addended by: Cornelius Moras D on: 08/04/2020 11:57 AM   Modules accepted: Orders

## 2020-08-04 NOTE — Patient Instructions (Signed)
Labor Induction 1200 [facility] tonight at L&D  Labor induction is when steps are taken to cause a pregnant woman to begin the labor process. Most women go into labor on their own between 37 weeks and 42 weeks of pregnancy. When this does not happen or when there is a medical need for labor to begin, steps may be taken to induce labor. Labor induction causes a pregnant woman's uterus to contract. It also causes the cervix to soften (ripen), open (dilate), and thin out (efface). Usually, labor is not induced before 39 weeks of pregnancy unless there is a medical reason to do so. Your health care provider will determine if labor induction is needed. Before inducing labor, your health care provider will consider a number of factors, including:  Your medical condition and your baby's.  How many weeks along you are in your pregnancy.  How mature your baby's lungs are.  The condition of your cervix.  The position of your baby.  The size of your birth canal. What are some reasons for labor induction? Labor may be induced if:  Your health or your baby's health is at risk.  Your pregnancy is overdue by 1 week or more.  Your water breaks but labor does not start on its own.  There is a low amount of amniotic fluid around your baby. You may also choose (elect) to have labor induced at a certain time. Generally, elective labor induction is done no earlier than 39 weeks of pregnancy. What methods are used for labor induction? Methods used for labor induction include:  Prostaglandin medicine. This medicine starts contractions and causes the cervix to dilate and ripen. It can be taken by mouth (orally) or by being inserted into the vagina (suppository).  Inserting a small, thin tube (catheter) with a balloon into the vagina and then expanding the balloon with water to dilate the cervix.  Stripping the membranes. In this method, your health care provider gently separates amniotic sac tissue from the  cervix. This causes the cervix to stretch, which in turn causes the release of a hormone called progesterone. The hormone causes the uterus to contract. This procedure is often done during an office visit, after which you will be sent home to wait for contractions to begin.  Breaking the water. In this method, your health care provider uses a small instrument to make a small hole in the amniotic sac. This eventually causes the amniotic sac to break. Contractions should begin after a few hours.  Medicine to trigger or strengthen contractions. This medicine is given through an IV that is inserted into a vein in your arm. Except for membrane stripping, which can be done in a clinic, labor induction is done in the hospital so that you and your baby can be carefully monitored. How long does it take for labor to be induced? The length of time it takes to induce labor depends on how ready your body is for labor. Some inductions can take up to 2-3 days, while others may take less than a day. Induction may take longer if:  You are induced early in your pregnancy.  It is your first pregnancy.  Your cervix is not ready. What are some risks associated with labor induction? Some risks associated with labor induction include:  Changes in fetal heart rate, such as being too high, too low, or irregular (erratic).  Failed induction.  Infection in the mother or the baby.  Increased risk of having a cesarean delivery.  Fetal  death.  Breaking off (abruption) of the placenta from the uterus (rare).  Rupture of the uterus (very rare). When induction is needed for medical reasons, the benefits of induction generally outweigh the risks. What are some reasons for not inducing labor? Labor induction should not be done if:  Your baby does not tolerate contractions.  You have had previous surgeries on your uterus, such as a myomectomy, removal of fibroids, or a vertical scar from a previous cesarean  delivery.  Your placenta lies very low in your uterus and blocks the opening of the cervix (placenta previa).  Your baby is not in a head-down position.  The umbilical cord drops down into the birth canal in front of the baby.  There are unusual circumstances, such as the baby being very early (premature).  You have had more than 2 previous cesarean deliveries. Summary  Labor induction is when steps are taken to cause a pregnant woman to begin the labor process.  Labor induction causes a pregnant woman's uterus to contract. It also causes the cervix to ripen, dilate, and efface.  Labor is not induced before 39 weeks of pregnancy unless there is a medical reason to do so.  When induction is needed for medical reasons, the benefits of induction generally outweigh the risks. This information is not intended to replace advice given to you by your health care provider. Make sure you discuss any questions you have with your health care provider. Document Revised: 09/14/2017 Document Reviewed: 10/25/2016 Elsevier Patient Education  2020 [employer].

## 2020-08-04 NOTE — Progress Notes (Signed)
  [facility] INDUCTION ASSESSMENT SCHEDULING Marissa Herman 1991-07-26 Medical record #: [id number] Phone #:  Home Phone [phone]    [facility] Proposed admission date/time:08/05/20 at [facility] Method of induction:Cytotec  Weight: Filed Weights11/10/21 1021Weight:211 lb (95.7 kg) BMI Body mass index is 34.58 kg/m. HIV Negative HSV Negative EDC Estimated Date of Delivery: 11/11/21based on:LMP  Gestational age on admission: 39 Gravidity/parity:G2P1001  Cervix Score   0 1 2 3   Position Posterior     Consistency   Soft   Effacement (%)   60-70   Dilation (cm)  1-2    Baby's station -3      Bishop Score:5 Cx 2/70/-3, soft, post  Medical induction of labor  select indication(s) below Elective induction ?39 weeks multiparous patient ?39 weeks primiparous patient with Bishop score ?7 ?40 weeks primiparous patient   Medical Indications Adapted from ACOG Committee Opinion #560, "Medically Indicated Late Preterm and Early Term Deliveries," 2013.  PLACENTAL / UTERINE ISSUES FETAL ISSUES MATERNAL ISSUES  ? Placenta previa (36.0-37.6) ? Isoimmunization (37.0-38.6) ? Preeclampsia without severe features or gestational HTN (37.0)  ? Suspected accreta (34.0-35.6) ? Growth Restriction 11-16-1984) ? Preeclampsia with severe features (34.0)  ? Prior classical CD, uterine window, rupture (36.0-37.6) ? Isolated (38.0-39.6) ? Chronic HTN (38.0-39.6)  ? Prior myomectomy (37.0-38.6) ? Concurrent findings (34.0-37.6) ? Cholestasis (37.0)  ? Umbilical vein varix (37.0) ? Growth Restriction (Twins) ? Diabetes  ? Placental abruption (chronic) ? Di-Di Isolated (36.0-37.6) ? Pregestational, controlled (39.0)  OBSTETRIC ISSUES ? Di-Di concurrent findings (32.0-34.6) ? Pregestational, uncontrolled (37.0-39.0)  ? Postdates ? (41 weeks) ? Mo-Di isolated (32.0-34.6) ? Pregestational, vascular compromise (37.0- 39.0)  ? PPROM (34.0)  ? Multiple Gestation ? Gestational, diet controlled (40.0)  ? Hx of IUFD (39.0 weeks) ? Di-Di (38.0-38.6) ? Gestational, med controlled (39.0)  ? Polyhydramnios, mild/moderate; SDV 8-16 or AFI 25-35 (39.0) ? Mo-Di (36.0-37.6) ? Gestational, uncontrolled (38.0-39.0)  ? Oligohydramnios (36.0-37.6); MVP <2 cm    Provider Signature: 12-12-1988 Scheduled Letitia Libra Date:08/04/2020 11:25 AM   Call [phone] to finalize the induction date/time  [phone] (07/17)

## 2020-08-04 NOTE — Addendum Note (Signed)
Addended by: Nadara Mustard on: 08/04/2020 11:36 AM   Modules accepted: Orders, SmartSet

## 2020-08-05 ENCOUNTER — Inpatient Hospital Stay: Payer: BC Managed Care – PPO | Admitting: Anesthesiology

## 2020-08-05 ENCOUNTER — Inpatient Hospital Stay
Admission: EM | Admit: 2020-08-05 | Discharge: 2020-08-06 | DRG: 806 | Disposition: A | Discharge status: Home / Self Care | Payer: BC Managed Care – PPO | Attending: Obstetrics | Admitting: Obstetrics

## 2020-08-05 ENCOUNTER — Encounter
Admission: EM | Disposition: A | Discharge status: Home / Self Care | Payer: Self-pay | Source: Home / Self Care | Attending: Obstetrics

## 2020-08-05 ENCOUNTER — Encounter: Payer: Self-pay | Admitting: Obstetrics & Gynecology

## 2020-08-05 DIAGNOSIS — Z3A40 40 weeks gestation of pregnancy: Secondary | ICD-10-CM | POA: Diagnosis not present

## 2020-08-05 DIAGNOSIS — O480 Post-term pregnancy: Secondary | ICD-10-CM

## 2020-08-05 DIAGNOSIS — Z87891 Personal history of nicotine dependence: Secondary | ICD-10-CM

## 2020-08-05 DIAGNOSIS — Z3A39 39 weeks gestation of pregnancy: Secondary | ICD-10-CM | POA: Diagnosis not present

## 2020-08-05 DIAGNOSIS — Z20822 Contact with and (suspected) exposure to covid-19: Secondary | ICD-10-CM | POA: Diagnosis present

## 2020-08-05 DIAGNOSIS — O26893 Other specified pregnancy related conditions, third trimester: Secondary | ICD-10-CM | POA: Diagnosis present

## 2020-08-05 HISTORY — PX: PERINEAL LACERATION REPAIR: SHX5389

## 2020-08-05 LAB — CBC
HCT: 31 % — ABNORMAL LOW (ref 36.0–46.0)
Hemoglobin: 10.4 g/dL — ABNORMAL LOW (ref 12.0–15.0)
MCH: 29.2 pg (ref 26.0–34.0)
MCHC: 33.5 g/dL (ref 30.0–36.0)
MCV: 87.1 fL (ref 80.0–100.0)
Platelets: 201 10*3/uL (ref 150–400)
RBC: 3.56 MIL/uL — ABNORMAL LOW (ref 3.87–5.11)
RDW: 12.4 % (ref 11.5–15.5)
WBC: 13.8 10*3/uL — ABNORMAL HIGH (ref 4.0–10.5)
nRBC: 0 % (ref 0.0–0.2)

## 2020-08-05 LAB — RESPIRATORY PANEL BY RT PCR (FLU A&B, COVID)
Influenza A by PCR: NEGATIVE
Influenza B by PCR: NEGATIVE
SARS Coronavirus 2 by RT PCR: NEGATIVE

## 2020-08-05 LAB — RPR: RPR Ser Ql: NONREACTIVE

## 2020-08-05 LAB — TYPE AND SCREEN
ABO/RH(D): O POS
Antibody Screen: NEGATIVE

## 2020-08-05 SURGERY — SUTURE REPAIR, LACERATION, PERINEUM
Anesthesia: Epidural

## 2020-08-05 SURGERY — Surgical Case

## 2020-08-05 MED ORDER — DIBUCAINE (PERIANAL) 1 % EX OINT: 1.0000 "application " | TOPICAL_OINTMENT | CUTANEOUS | Status: DC | PRN

## 2020-08-05 MED ORDER — DOCUSATE SODIUM 100 MG PO CAPS
100.0000 mg | ORAL_CAPSULE | Freq: Two times a day (BID) | ORAL | Status: DC
  Administered 2020-08-05 – 2020-08-06 (×2): 100 mg via ORAL
  Filled 2020-08-05 (×2): qty 1

## 2020-08-05 MED ORDER — PROPOFOL 10 MG/ML IV BOLUS
INTRAVENOUS | Status: AC
  Filled 2020-08-05: qty 20

## 2020-08-05 MED ORDER — CARBOPROST TROMETHAMINE 250 MCG/ML IM SOLN
INTRAMUSCULAR | Status: AC
  Administered 2020-08-05: 250 ug via INTRAMUSCULAR
  Filled 2020-08-05: qty 1

## 2020-08-05 MED ORDER — LIDOCAINE HCL (PF) 1 % IJ SOLN: 30.0000 mL | INTRAMUSCULAR | Status: DC | PRN

## 2020-08-05 MED ORDER — OXYCODONE HCL 5 MG PO TABS: 5.0000 mg | ORAL_TABLET | Freq: Once | ORAL | Status: DC | PRN

## 2020-08-05 MED ORDER — OXYCODONE HCL 5 MG/5ML PO SOLN: 5.0000 mg | Freq: Once | ORAL | Status: DC | PRN

## 2020-08-05 MED ORDER — LIDOCAINE HCL (PF) 1 % IJ SOLN
INTRAMUSCULAR | Status: DC | PRN
  Administered 2020-08-05: 3 mL

## 2020-08-05 MED ORDER — METHYLERGONOVINE MALEATE 0.2 MG/ML IJ SOLN: 0.2000 mg | INTRAMUSCULAR | Status: DC | PRN

## 2020-08-05 MED ORDER — OXYTOCIN-SODIUM CHLORIDE 30-0.9 UT/500ML-% IV SOLN
1.0000 m[IU]/min | INTRAVENOUS | Status: DC
  Administered 2020-08-05: 8 m[IU]/min via INTRAVENOUS

## 2020-08-05 MED ORDER — TERBUTALINE SULFATE 1 MG/ML IJ SOLN: 0.2500 mg | Freq: Once | INTRAMUSCULAR | Status: DC | PRN

## 2020-08-05 MED ORDER — PHENYLEPHRINE 40 MCG/ML (10ML) SYRINGE FOR IV PUSH (FOR BLOOD PRESSURE SUPPORT): 80.0000 ug | PREFILLED_SYRINGE | INTRAVENOUS | Status: DC | PRN

## 2020-08-05 MED ORDER — SIMETHICONE 80 MG PO CHEW: 80.0000 mg | CHEWABLE_TABLET | ORAL | Status: DC | PRN

## 2020-08-05 MED ORDER — IBUPROFEN 600 MG PO TABS
600.0000 mg | ORAL_TABLET | Freq: Four times a day (QID) | ORAL | Status: DC
  Administered 2020-08-05: 600 mg via ORAL
  Filled 2020-08-05: qty 1

## 2020-08-05 MED ORDER — FENTANYL CITRATE (PF) 100 MCG/2ML IJ SOLN
INTRAMUSCULAR | Status: DC | PRN
  Administered 2020-08-05: 100 ug via EPIDURAL

## 2020-08-05 MED ORDER — FENTANYL 2.5 MCG/ML W/ROPIVACAINE 0.15% IN NS 100 ML EPIDURAL (ARMC)
EPIDURAL | Status: AC
  Administered 2020-08-05: 12 mL/h via EPIDURAL
  Filled 2020-08-05: qty 100

## 2020-08-05 MED ORDER — FENTANYL CITRATE (PF) 100 MCG/2ML IJ SOLN: 25.0000 ug | INTRAMUSCULAR | Status: DC | PRN

## 2020-08-05 MED ORDER — WITCH HAZEL-GLYCERIN EX PADS: 1.0000 "application " | MEDICATED_PAD | CUTANEOUS | Status: DC | PRN

## 2020-08-05 MED ORDER — LACTATED RINGERS IV SOLN
500.0000 mL | INTRAVENOUS | Status: DC | PRN
  Administered 2020-08-05: 1000 mL via INTRAVENOUS

## 2020-08-05 MED ORDER — FENTANYL CITRATE (PF) 100 MCG/2ML IJ SOLN
INTRAMUSCULAR | Status: AC
  Filled 2020-08-05: qty 2

## 2020-08-05 MED ORDER — MISOPROSTOL 25 MCG QUARTER TABLET
25.0000 ug | ORAL_TABLET | ORAL | Status: DC | PRN
  Administered 2020-08-05 (×2): 25 ug via VAGINAL
  Filled 2020-08-05 (×2): qty 1

## 2020-08-05 MED ORDER — AMMONIA AROMATIC IN INHA
RESPIRATORY_TRACT | Status: AC
  Filled 2020-08-05: qty 10

## 2020-08-05 MED ORDER — OXYTOCIN-SODIUM CHLORIDE 30-0.9 UT/500ML-% IV SOLN
1.0000 m[IU]/min | INTRAVENOUS | Status: DC
  Administered 2020-08-05: 2 m[IU]/min via INTRAVENOUS
  Filled 2020-08-05: qty 500

## 2020-08-05 MED ORDER — LIDOCAINE HCL (PF) 2 % IJ SOLN
INTRAMUSCULAR | Status: DC | PRN
  Administered 2020-08-05 (×3): 100 mg via EPIDURAL

## 2020-08-05 MED ORDER — LIDOCAINE-EPINEPHRINE (PF) 1.5 %-1:200000 IJ SOLN
INTRAMUSCULAR | Status: DC | PRN
  Administered 2020-08-05: 3 mL via PERINEURAL

## 2020-08-05 MED ORDER — COCONUT OIL OIL
1.0000 "application " | TOPICAL_OIL | Status: DC | PRN
  Filled 2020-08-05: qty 120

## 2020-08-05 MED ORDER — MIDAZOLAM HCL 2 MG/2ML IJ SOLN
INTRAMUSCULAR | Status: DC | PRN
  Administered 2020-08-05: 2 mg via INTRAVENOUS

## 2020-08-05 MED ORDER — IBUPROFEN 600 MG PO TABS
600.0000 mg | ORAL_TABLET | Freq: Four times a day (QID) | ORAL | Status: DC
  Administered 2020-08-06 (×3): 600 mg via ORAL
  Filled 2020-08-05 (×3): qty 1

## 2020-08-05 MED ORDER — MISOPROSTOL 200 MCG PO TABS
ORAL_TABLET | ORAL | Status: AC
  Filled 2020-08-05: qty 4

## 2020-08-05 MED ORDER — DIPHENHYDRAMINE HCL 50 MG/ML IJ SOLN
25.0000 mg | Freq: Once | INTRAMUSCULAR | Status: AC
  Administered 2020-08-05: 25 mg via INTRAVENOUS
  Filled 2020-08-05: qty 1

## 2020-08-05 MED ORDER — ACETAMINOPHEN 500 MG PO TABS
1000.0000 mg | ORAL_TABLET | Freq: Four times a day (QID) | ORAL | Status: DC
  Administered 2020-08-05 – 2020-08-06 (×4): 1000 mg via ORAL
  Filled 2020-08-05 (×4): qty 2

## 2020-08-05 MED ORDER — ZOLPIDEM TARTRATE 5 MG PO TABS: 5.0000 mg | ORAL_TABLET | Freq: Every evening | ORAL | Status: DC | PRN

## 2020-08-05 MED ORDER — BENZOCAINE-MENTHOL 20-0.5 % EX AERO: 1.0000 "application " | INHALATION_SPRAY | CUTANEOUS | Status: DC | PRN

## 2020-08-05 MED ORDER — PRENATAL MULTIVITAMIN CH
1.0000 | ORAL_TABLET | Freq: Every day | ORAL | Status: DC
  Administered 2020-08-06: 1 via ORAL
  Filled 2020-08-05: qty 1

## 2020-08-05 MED ORDER — ONDANSETRON HCL 4 MG/2ML IJ SOLN: 4.0000 mg | INTRAMUSCULAR | Status: DC | PRN

## 2020-08-05 MED ORDER — LACTATED RINGERS IV SOLN
500.0000 mL | Freq: Once | INTRAVENOUS | Status: AC
  Administered 2020-08-05: 500 mL via INTRAVENOUS

## 2020-08-05 MED ORDER — LACTATED RINGERS IV SOLN: INTRAVENOUS | Status: DC | PRN

## 2020-08-05 MED ORDER — BUTORPHANOL TARTRATE 1 MG/ML IJ SOLN: 1.0000 mg | INTRAMUSCULAR | Status: DC | PRN

## 2020-08-05 MED ORDER — LIDOCAINE HCL (PF) 2 % IJ SOLN
INTRAMUSCULAR | Status: AC
  Filled 2020-08-05: qty 15

## 2020-08-05 MED ORDER — ACETAMINOPHEN 325 MG PO TABS: 650.0000 mg | ORAL_TABLET | ORAL | Status: DC | PRN

## 2020-08-05 MED ORDER — LIDOCAINE HCL (PF) 1 % IJ SOLN
INTRAMUSCULAR | Status: AC
  Filled 2020-08-05: qty 30

## 2020-08-05 MED ORDER — DIPHENHYDRAMINE HCL 25 MG PO CAPS: 25.0000 mg | ORAL_CAPSULE | Freq: Four times a day (QID) | ORAL | Status: DC | PRN

## 2020-08-05 MED ORDER — DIPHENHYDRAMINE HCL 50 MG/ML IJ SOLN: 12.5000 mg | INTRAMUSCULAR | Status: DC | PRN

## 2020-08-05 MED ORDER — OXYTOCIN 10 UNIT/ML IJ SOLN
INTRAMUSCULAR | Status: AC
  Filled 2020-08-05: qty 2

## 2020-08-05 MED ORDER — MIDAZOLAM HCL 2 MG/2ML IJ SOLN
INTRAMUSCULAR | Status: AC
  Filled 2020-08-05: qty 2

## 2020-08-05 MED ORDER — OXYTOCIN BOLUS FROM INFUSION
333.0000 mL | Freq: Once | INTRAVENOUS | Status: AC
  Administered 2020-08-05: 333 mL via INTRAVENOUS

## 2020-08-05 MED ORDER — ONDANSETRON HCL 4 MG PO TABS: 4.0000 mg | ORAL_TABLET | ORAL | Status: DC | PRN

## 2020-08-05 MED ORDER — EPHEDRINE 5 MG/ML INJ: 10.0000 mg | INTRAVENOUS | Status: DC | PRN

## 2020-08-05 MED ORDER — BUPIVACAINE HCL (PF) 0.25 % IJ SOLN
INTRAMUSCULAR | Status: DC | PRN
  Administered 2020-08-05 (×2): 5 mL via EPIDURAL

## 2020-08-05 MED ORDER — FENTANYL 2.5 MCG/ML W/ROPIVACAINE 0.15% IN NS 100 ML EPIDURAL (ARMC)
12.0000 mL/h | EPIDURAL | Status: DC
  Administered 2020-08-05: 12 mL/h via EPIDURAL
  Filled 2020-08-05: qty 100

## 2020-08-05 MED ORDER — ONDANSETRON HCL 4 MG/2ML IJ SOLN
4.0000 mg | Freq: Four times a day (QID) | INTRAMUSCULAR | Status: DC | PRN
  Administered 2020-08-05: 4 mg via INTRAVENOUS
  Filled 2020-08-05: qty 2

## 2020-08-05 MED ORDER — OXYTOCIN-SODIUM CHLORIDE 30-0.9 UT/500ML-% IV SOLN
2.5000 [IU]/h | INTRAVENOUS | Status: DC
  Administered 2020-08-05 (×3): 10 [IU] via INTRAVENOUS
  Filled 2020-08-05: qty 500

## 2020-08-05 MED ORDER — CARBOPROST TROMETHAMINE 250 MCG/ML IM SOLN: 250.0000 ug | Freq: Once | INTRAMUSCULAR | Status: DC

## 2020-08-05 MED ORDER — LACTATED RINGERS IV SOLN: INTRAVENOUS | Status: DC

## 2020-08-05 MED ORDER — METHYLERGONOVINE MALEATE 0.2 MG/ML IJ SOLN
INTRAMUSCULAR | Status: AC
  Administered 2020-08-05: 0.2 mg via INTRAMUSCULAR
  Filled 2020-08-05: qty 1

## 2020-08-05 MED ORDER — MISOPROSTOL 200 MCG PO TABS
ORAL_TABLET | ORAL | Status: AC
  Filled 2020-08-05: qty 5

## 2020-08-05 SURGICAL SUPPLY — 25 items
CANISTER SUCT 3000ML PPV (MISCELLANEOUS) ×2 IMPLANT
CHLORAPREP W/TINT 26 (MISCELLANEOUS) ×2 IMPLANT
COVER WAND RF STERILE (DRAPES) ×2 IMPLANT
DRSG TELFA 3X8 NADH (GAUZE/BANDAGES/DRESSINGS) ×2 IMPLANT
ELECT REM PT RETURN 9FT ADLT (ELECTROSURGICAL) ×2
ELECTRODE REM PT RTRN 9FT ADLT (ELECTROSURGICAL) ×1 IMPLANT
GAUZE SPONGE 4X4 12PLY STRL (GAUZE/BANDAGES/DRESSINGS) ×2 IMPLANT
GOWN STRL REUS W/ TWL LRG LVL3 (GOWN DISPOSABLE) ×3 IMPLANT
GOWN STRL REUS W/TWL LRG LVL3 (GOWN DISPOSABLE) ×3
MANIFOLD NEPTUNE II (INSTRUMENTS) ×2 IMPLANT
NEEDLE HYPO 25GX1X1/2 BEV (NEEDLE) ×2 IMPLANT
NS IRRIG 1000ML POUR BTL (IV SOLUTION) ×2 IMPLANT
PACK C SECTION AR (MISCELLANEOUS) ×2 IMPLANT
PAD OB MATERNITY 4.3X12.25 (PERSONAL CARE ITEMS) ×2 IMPLANT
PAD PREP 24X41 OB/GYN DISP (PERSONAL CARE ITEMS) ×2 IMPLANT
PENCIL SMOKE ULTRAEVAC 22 CON (MISCELLANEOUS) ×2 IMPLANT
SUT MNCRL 4-0 (SUTURE) ×1
SUT MNCRL 4-0 27XMFL (SUTURE) ×1
SUT VIC AB 0 CT1 36 (SUTURE) ×4 IMPLANT
SUT VIC AB 0 CTX 36 (SUTURE) ×2
SUT VIC AB 0 CTX36XBRD ANBCTRL (SUTURE) ×2 IMPLANT
SUT VIC AB 2-0 SH 27 (SUTURE) ×2
SUT VIC AB 2-0 SH 27XBRD (SUTURE) ×2 IMPLANT
SUTURE MNCRL 4-0 27XMF (SUTURE) ×1 IMPLANT
SYR 30ML LL (SYRINGE) ×4 IMPLANT

## 2020-08-05 SURGICAL SUPPLY — 30 items
BAG URINE DRAIN 2000ML AR STRL (UROLOGICAL SUPPLIES) IMPLANT
CANISTER SUCT 1200ML W/VALVE (MISCELLANEOUS) ×2 IMPLANT
CATH FOLEY 2WAY  5CC 16FR (CATHETERS)
CATH ROBINSON RED A/P 16FR (CATHETERS) ×2 IMPLANT
CATH URTH 16FR FL 2W BLN LF (CATHETERS) IMPLANT
COVER WAND RF STERILE (DRAPES) ×2 IMPLANT
DRAPE 3/4 80X56 (DRAPES) IMPLANT
DRAPE PERI LITHO V/GYN (MISCELLANEOUS) ×2 IMPLANT
DRAPE UNDER BUTTOCK W/FLU (DRAPES) ×2 IMPLANT
ELECT REM PT RETURN 9FT ADLT (ELECTROSURGICAL) ×2
ELECTRODE REM PT RTRN 9FT ADLT (ELECTROSURGICAL) ×1 IMPLANT
GAUZE 4X4 16PLY RFD (DISPOSABLE) IMPLANT
GAUZE PACK 2X3YD (PACKING) IMPLANT
GLOVE BIO SURGEON STRL SZ7 (GLOVE) ×8 IMPLANT
GOWN STRL REUS W/ TWL LRG LVL3 (GOWN DISPOSABLE) ×2 IMPLANT
GOWN STRL REUS W/TWL LRG LVL3 (GOWN DISPOSABLE) ×2
IV LACTATED RINGERS 1000ML (IV SOLUTION) IMPLANT
LABEL OR SOLS (LABEL) IMPLANT
MANIFOLD NEPTUNE II (INSTRUMENTS) IMPLANT
NEEDLE HYPO 22GX1.5 SAFETY (NEEDLE) IMPLANT
NS IRRIG 500ML POUR BTL (IV SOLUTION) ×2 IMPLANT
PACK BASIN MINOR (MISCELLANEOUS) ×2 IMPLANT
PAD OB MATERNITY 4.3X12.25 (PERSONAL CARE ITEMS) ×2 IMPLANT
PAD PREP 24X41 OB/GYN DISP (PERSONAL CARE ITEMS) ×2 IMPLANT
STRAP SAFETY 5IN WIDE (MISCELLANEOUS) IMPLANT
SUT VIC AB 0 SH 27 (SUTURE) ×2 IMPLANT
SUT VIC AB 2-0 SH 27 (SUTURE) ×2
SUT VIC AB 2-0 SH 27XBRD (SUTURE) ×2 IMPLANT
SUT VICRYL 3-0 27IN (SUTURE) IMPLANT
SYR CONTROL 10ML LL (SYRINGE) ×4 IMPLANT

## 2020-08-05 NOTE — Interval H&P Note (Signed)
History and Physical Interval Note:  08/05/2020 7:16 PM  Marissa Herman  has presented today for surgery, with the diagnosis of cervical laceration.  The various methods of treatment have been discussed with the patient and family. After consideration of risks, benefits and other options for treatment, the patient has consented to  Procedure(s): EXAM UNDER ANESTHESIA, REPAIR OF PERINEAL AND CERVICAL LACERATION (N/A) as a surgical intervention.  The patient's history has been reviewed, patient examined, no change in status, stable for surgery.  I have reviewed the patient's chart and labs.  Questions were answered to the patient's satisfaction.     Marissa Herman

## 2020-08-05 NOTE — Anesthesia Procedure Notes (Addendum)
Epidural Patient location during procedure: OB Start time: 08/05/2020 11:05 AM End time: 08/05/2020 11:27 AM  Staffing Resident/CRNA: Junious Silk, CRNA Performed: resident/CRNA   Preanesthetic Checklist Completed: patient identified, IV checked, site marked, risks and benefits discussed, surgical consent, monitors and equipment checked, pre-op evaluation and timeout performed  Epidural Patient position: sitting Prep: Betadine Patient monitoring: heart rate, continuous pulse ox and blood pressure Approach: midline Location: L3-L4 Injection technique: LOR saline and LOR air  Needle:  Needle type: Tuohy  Needle gauge: 17 G Needle length: 9 cm and 9 Catheter type: closed end flexible Catheter size: 20 Guage Test dose: negative and 1.5% lidocaine with Epi 1:200 K  Assessment Sensory level: T10 Events: blood not aspirated, injection not painful, no injection resistance, no paresthesia and negative IV test  Additional Notes   Patient tolerated the insertion well without complications.Reason for block:procedure for pain

## 2020-08-05 NOTE — Op Note (Signed)
Operative Note  08/05/2020  PRE-OP DIAGNOSIS: Cervical Laceration. Second degree perineal laceration. Right periurethral laceration  POST-OP DIAGNOSIS: Cervix Intact. Second degree perineal laceration. Right periurethral laceration.   SURGEON: Adelene Idler MD  PROCEDURE: Procedure(s): EXAM UNDER ANESTHESIA,  REPAIR PERINEAL LACERATION   ANESTHESIA: Choice   ESTIMATED BLOOD LOSS: 5 cc   SPECIMENS:  none  COMPLICATIONS: none  DISPOSITION: Labor and delivery  CONDITION: stable  FINDINGS: Exam under anesthesia revealed  20 cm normal postpartum uterus.  Cervix was walked with ring forceps and was intact, second degree perineal laceration. Right periurethral laceration.   PROCEDURE IN DETAIL: After informed consent was obtained, the patient was taken to the operating room where anesthesia was obtained without difficulty. The patient was positioned in the dorsal lithotomy position in Sanford stirrups. The patient's bladder was catheterized with an in and out foley catheter. The patient was examined under anesthesia, with the above noted findings. The weightedspeculum was placed inside the patient's vagina, and the the anterior lip of the cervix was seen and grasped with the ring forceps. The cervix was walked in 360 degrees. No cervical laceration was seen although the cervix was enlarged in the normal postpartum state.  Ring forceps were removed.  The attention was turned to her perineum. Her second degree laceration was repaired with 2-0 and 3-0 vicryl in a typical fashion. The right periurethral laceration was repaired with 3-0 vicryl with a running stitch.    All instruments were removed, with excellent hemostasis noted throughout. She was then taken out of dorsal lithotomy.   The patient tolerated the procedure well. Sponge, lap and needle counts were correct x2. The patient was taken to recovery room in excellent condition.  Adelene Idler MD [facility], [facility] 08/05/2020 8:31 PM

## 2020-08-05 NOTE — Progress Notes (Signed)
  Labor Progress Note   29 y.o. year old G2P1001 at [redacted]w[redacted]d weeks gestation admitted for induction of labor  Subjective:  She is feeling well. She is comfortable with her epidural.   Objective:   Vitals:   08/05/20 1308 08/05/20 1311  BP: 107/64   Pulse: 86   Resp:    Temp:    SpO2:  98%    Abd: non-tender Extr: no edema  EFM: FHR: 145 bpm, variability: moderate,  accelerations:  Present,  decelerations:  Absent Toco: Frequency: every 2-3 minutes  SVE: 3-4/50/-3 AROM performed, IUPC placed  Assessment & Plan:  29 y.o. year old G2P1001 at [redacted]w[redacted]d weeks gestation admitted for induction of labor Pregnancy and Labor/Delivery Management  1. Pain management:  2. FWB: FHT category 1 3. ID: GBS Negative 4. Labor management: AROM, clear fluid, no issues.  IUPC placed Continue with pitocin. Will start high dose pitocin. Her last IOL she required pitocin at 30 mu.   All discussed with patient, see orders  Adelene Idler MD [facility], [facility] 08/05/2020 2:13 PM

## 2020-08-05 NOTE — Progress Notes (Signed)
Marissa Bradford, RN in [place] notified that patient will be going to mother baby room 341. Mother baby notified of patient disposition and is anticipating patient arrival post procedure.

## 2020-08-05 NOTE — Plan of Care (Signed)
POC discussed with patient, questions answered by [facility]. Pt and husband excited to meet their baby.

## 2020-08-05 NOTE — Progress Notes (Signed)
  Labor Progress Note   29 y.o. G2P1001 @ [redacted]w[redacted]d , admitted for  Pregnancy, Labor Management.   Subjective:  Coping well with contractions, ambulating in the room  Objective:  BP 129/85 (BP Location: Right Arm)   Pulse 86   Temp 98.1 F (36.7 C) (Oral)   Resp 16   Ht 5' 5.5" (1.664 m)   Wt 95.7 kg   LMP 10/30/2019 (Exact Date)   BMI 34.58 kg/m  Abd: gravid, ND, FHT present, mild tenderness on exam Extr: no edema SVE: CERVIX: 3.5 cm dilated, 60 effaced, -2 station, cervix swept  EFM: FHR: 150 bpm, variability: moderate,  accelerations:  Present,  decelerations:  Present a few variables seen Toco: Frequency: Every 2-4 minutes Labs: I have reviewed the patient's lab results.   Assessment & Plan:  G2P1001 @ [redacted]w[redacted]d, admitted for  Pregnancy and Labor/Delivery Management  1. Pain management: position changes. 2. FWB: FHT category II overall reassuring.  3. ID: GBS negative 4. Labor management: s/p cervical sweep, start pitocin as needed  All discussed with patient, see orders   Tresea Mall, CNM [facility] 08/05/2020  9:26 AM

## 2020-08-05 NOTE — Progress Notes (Signed)
One sponge left in Vagina per Schuman MD while waiting to go to OR for cervical laceration repair.

## 2020-08-05 NOTE — Transfer of Care (Signed)
Immediate Anesthesia Transfer of Care Note  Patient: [place]  Procedure(s) Performed: SUTURE REPAIR PERINEAL LACERATION (N/A )  Patient Location: Mother/Baby  Anesthesia Type:Epidural  Level of Consciousness: awake and alert   Airway & Oxygen Therapy: Patient Spontanous Breathing  Post-op Assessment: Report given to RN and Post -op Vital signs reviewed and stable  Post vital signs: Reviewed and stable  Last Vitals:  Vitals Value Taken Time  BP    Temp    Pulse    Resp    SpO2      Last Pain:  Vitals:   08/05/20 1728  TempSrc:   PainSc: 1          Complications: No complications documented.

## 2020-08-05 NOTE — Progress Notes (Signed)
Patient arrived to unit and placed back in bed LDR 4  from OR. Report given by Cala Bradford, RN.

## 2020-08-05 NOTE — Anesthesia Preprocedure Evaluation (Addendum)
Anesthesia Evaluation  Patient identified by MRN, date of birth, ID band Patient awake    Reviewed: Allergy & Precautions, H&P , NPO status , Patient's Chart, lab work & pertinent test results  History of Anesthesia Complications Negative for: history of anesthetic complications  Airway Mallampati: II  TM Distance: >3 FB Neck ROM: full    Dental no notable dental hx. (+) Chipped   Pulmonary former smoker,    Pulmonary exam normal        Cardiovascular Exercise Tolerance: Good negative cardio ROS Normal cardiovascular exam     Neuro/Psych  Headaches, negative psych ROS   GI/Hepatic Neg liver ROS, GERD  Medicated and Controlled,  Endo/Other  negative endocrine ROS  Renal/GU negative Renal ROS  negative genitourinary   Musculoskeletal   Abdominal   Peds  Hematology negative hematology ROS (+)   Anesthesia Other Findings Past Medical History: 09/10/2016: History of abnormal cervical Pap smear     Comment:  LGSIL/ biopsy +HPV changes No date: Medical history non-contributory  Past Surgical History: 11/10/2016: COLPOSCOPY     Comment:  bethesda LSIL No date: WISDOM TOOTH EXTRACTION  BMI    Body Mass Index: 34.58 kg/m      Reproductive/Obstetrics (+) Pregnancy                             Anesthesia Physical  Anesthesia Plan  ASA: II and emergent  Anesthesia Plan: Epidural   Post-op Pain Management:    Induction:   PONV Risk Score and Plan:   Airway Management Planned: Natural Airway  Additional Equipment:   Intra-op Plan:   Post-operative Plan:   Informed Consent: I have reviewed the patients History and Physical, chart, labs and discussed the procedure including the risks, benefits and alternatives for the proposed anesthesia with the patient or authorized representative who has indicated his/her understanding and acceptance.     Dental Advisory Given  Plan  Discussed with: CRNA and Anesthesiologist  Anesthesia Plan Comments: (Plan to use epidural for procedure, consented for backup GA  Patient consented for risks of anesthesia including but not limited to:  - adverse reactions to medications - risk of bleeding, infection and or nerve damage from epidural that could lead to paralysis - risk of headache or failed epidural - nerve damage due to positioning - Damage to heart, brain, lungs, other parts of body or loss of life  Patient voiced understanding.)      Anesthesia Quick Evaluation

## 2020-08-05 NOTE — Progress Notes (Signed)
Patient transported to Pre-Op via bed to undergo repair of cervical and perineal lacerations. Patient has consent signed in chart and Main OR staff is awaiting patient. Anesthesia stated they are ready for patient. SBAR report given to staff and notified that patient has 1 sponge packed in vagina done by Dr. Jerene Pitch. RN acknowledged report.

## 2020-08-05 NOTE — Anesthesia Preprocedure Evaluation (Signed)
Anesthesia Evaluation  Patient identified by MRN, date of birth, ID band Patient awake    Reviewed: Allergy & Precautions, H&P , NPO status , Patient's Chart, lab work & pertinent test results  History of Anesthesia Complications Negative for: history of anesthetic complications  Airway Mallampati: II  TM Distance: >3 FB Neck ROM: full    Dental no notable dental hx.    Pulmonary neg pulmonary ROS, former smoker,    Pulmonary exam normal        Cardiovascular negative cardio ROS Normal cardiovascular exam     Neuro/Psych negative neurological ROS  negative psych ROS   GI/Hepatic negative GI ROS, Neg liver ROS,   Endo/Other  negative endocrine ROS  Renal/GU negative Renal ROS  negative genitourinary   Musculoskeletal   Abdominal   Peds  Hematology negative hematology ROS (+)   Anesthesia Other Findings   Reproductive/Obstetrics (+) Pregnancy                             Anesthesia Physical Anesthesia Plan  ASA: II  Anesthesia Plan: Epidural   Post-op Pain Management:    Induction:   PONV Risk Score and Plan:   Airway Management Planned:   Additional Equipment:   Intra-op Plan:   Post-operative Plan:   Informed Consent:   Plan Discussed with: CRNA and Anesthesiologist  Anesthesia Plan Comments:         Anesthesia Quick Evaluation

## 2020-08-06 ENCOUNTER — Encounter: Payer: Self-pay | Admitting: Obstetrics and Gynecology

## 2020-08-06 LAB — CBC
HCT: 26 % — ABNORMAL LOW (ref 36.0–46.0)
Hemoglobin: 8.9 g/dL — ABNORMAL LOW (ref 12.0–15.0)
MCH: 30.1 pg (ref 26.0–34.0)
MCHC: 34.2 g/dL (ref 30.0–36.0)
MCV: 87.8 fL (ref 80.0–100.0)
Platelets: 167 10*3/uL (ref 150–400)
RBC: 2.96 MIL/uL — ABNORMAL LOW (ref 3.87–5.11)
RDW: 12.5 % (ref 11.5–15.5)
WBC: 13.8 10*3/uL — ABNORMAL HIGH (ref 4.0–10.5)
nRBC: 0 % (ref 0.0–0.2)

## 2020-08-06 MED ORDER — OXYCODONE HCL 5 MG PO TABS
5.0000 mg | ORAL_TABLET | ORAL | Status: DC | PRN
  Administered 2020-08-06 (×2): 5 mg via ORAL
  Filled 2020-08-06 (×2): qty 1

## 2020-08-06 MED ORDER — FERROUS SULFATE 325 (65 FE) MG PO TABS
325.0000 mg | ORAL_TABLET | Freq: Two times a day (BID) | ORAL | Status: DC
  Administered 2020-08-06 (×2): 325 mg via ORAL
  Filled 2020-08-06 (×2): qty 1

## 2020-08-06 MED ORDER — IBUPROFEN 600 MG PO TABS
600.0000 mg | ORAL_TABLET | Freq: Four times a day (QID) | ORAL | 0 refills | Status: DC
Start: 2020-08-06 — End: 2020-09-25

## 2020-08-06 MED ORDER — FERROUS SULFATE 325 (65 FE) MG PO TABS: 325.0000 mg | ORAL_TABLET | Freq: Two times a day (BID) | ORAL | 0 refills | Status: DC

## 2020-08-06 NOTE — Progress Notes (Signed)
Post Partum Day 1 Subjective: no complaints, up ad lib, voiding, tolerating PO and and having severe cramping. Using a heating pad for pain. Has had Ibuprofen and Tylenol only for pain since delivery and PP uterine exploration.  Objective: Blood pressure 125/82, pulse 77, temperature 98.8 F (37.1 C), temperature source Oral, resp. rate 18, height 5' 5.5" (1.664 m), weight 95.7 kg, last menstrual period 10/30/2019, SpO2 97 %, unknown if currently breastfeeding.  Physical Exam:  General: alert, cooperative, fatigued and mild distress Lochia: appropriate Uterine Fundus: firm Incision: healing well DVT Evaluation: No evidence of DVT seen on physical exam. Negative Homan's sign. No cords or calf tenderness.  Recent Labs    08/05/20 0024 08/06/20 0639  HGB 10.4* 8.9*  HCT 31.0* 26.0*    Assessment/Plan: Breastfeeding and Contraception  she is now undecided regarding having a BTL tomorrow morning.  Discussed the convenieince of having the BTL while she is still admitted vs an interval BTL. She will let Elizabeth Sauer know whether she opts to proceed with the tubal tomorrow morning.  Would be NPO after midnight tonight- OR has set 0715 tomorrow for the BTL. Roxicodone ordered for severe cramping today. Low H and H due to PP bleed. Iron ordered. Discharge either tonight or tomorrow   LOS: 1 day   Mirna Mires 08/06/2020, 9:46 AM

## 2020-08-06 NOTE — Discharge Summary (Signed)
OB Discharge Summary     Patient Name: Marissa Herman DOB: 1991/09/07 MRN: [id number]  Date of admission: 08/05/2020 Delivering MD: Adelene Idler R   Date of discharge: 08/06/2020  Admitting diagnosis: Normal labor and delivery [O80] Intrauterine pregnancy: [redacted]w[redacted]d     Secondary diagnosis:  Active Problems:   Normal labor and delivery  Additional problems: cervical laceration - exam under anesthesia and repair     Discharge diagnosis: Term Pregnancy Delivered                                                                                                Post partum procedures: EUA repair cervical laceration  Augmentation: AROM and Pitocin  Complications: None  Hospital course:  Induction of Labor With Vaginal Delivery   29 y.o. yo [id number] at [redacted]w[redacted]d was admitted to the hospital 08/05/2020 for induction of labor.  Indication for induction: Elective.  Patient had an uncomplicated labor course as follows: Membrane Rupture Time/Date: 2:03 PM ,08/05/2020   Delivery Method:Vaginal, Spontaneous  Episiotomy:   Lacerations:    Details of delivery can be found in separate delivery note.  Patient had a routine postpartum course. Patient is discharged home 08/06/20.  Newborn Data: Birth date:08/05/2020  Birth time:6:36 PM  Gender:Female  Living status:Living  Apgars:9 ,10  Weight:3550 g   Physical exam  Vitals:   08/06/20 0410 08/06/20 0731 08/06/20 1151 08/06/20 1653  BP: 115/72 125/82 110/70 107/77  Pulse: 70 77 72 72  Resp: 18 18 20 18   Temp: 98.5 F (36.9 C) 98.8 F (37.1 C) 98.4 F (36.9 C) 98.1 F (36.7 C)  TempSrc: Oral Oral Oral Oral  SpO2: 96% 97%    Weight:      Height:        Labs: Lab Results  Component Value Date   WBC 13.8 (H) 08/06/2020   HGB 8.9 (L) 08/06/2020   HCT 26.0 (L) 08/06/2020   MCV 87.8 08/06/2020   PLT 167 08/06/2020   No flowsheet data found.  Discharge instruction: per After Visit Summary and "Baby and Me  Booklet".  After visit meds:  Allergies as of 08/06/2020      Reactions   Other Rash   Nitrile gloves      Medication List    TAKE these medications   calcium carbonate 500 MG chewable tablet Commonly known as: TUMS - dosed in mg elemental calcium Chew 1 tablet by mouth as needed for indigestion or heartburn.   Colace 100 MG capsule Generic drug: docusate sodium Take 1 capsule by mouth in the morning, at noon, and at bedtime.   ferrous sulfate 325 (65 FE) MG tablet Take 1 tablet (325 mg total) by mouth 2 (two) times daily with a meal. Start taking on: August 07, 2020   ibuprofen 600 MG tablet Commonly known as: ADVIL Take 1 tablet (600 mg total) by mouth every 6 (six) hours.   omeprazole 20 MG capsule Commonly known as: PRILOSEC Take 1 capsule (20 mg total) by mouth daily.   PRENATAL GUMMIES/DHA & FA PO Take 1 tablet by mouth.  Diet: routine diet  Activity: Advance as tolerated. Pelvic rest for 6 weeks.   Outpatient follow up:2 weeks Follow up Appt:No future appointments. Follow up Visit:No follow-ups on file.  Postpartum contraception: interval tubal  Newborn Data: Live born female  Birth Weight: 7 lb 13.2 oz (3550 g) APGAR: 9, 10  Newborn Delivery   Birth date/time: 08/05/2020 18:36:00 Delivery type: Vaginal, Spontaneous      Baby Feeding: Breast Disposition:home with mother   08/06/2020 Vena Austria, MD

## 2020-08-06 NOTE — Discharge Instructions (Signed)
Postpartum Care After Vaginal Delivery °This sheet gives you information about how to care for yourself from the time you deliver your baby to up to 6-12 weeks after delivery (postpartum period). Your health care provider may also give you more specific instructions. If you have problems or questions, contact your health care provider. °Follow these instructions at home: °Vaginal bleeding °· It is normal to have vaginal bleeding (lochia) after delivery. Wear a sanitary pad for vaginal bleeding and discharge. °? During the first week after delivery, the amount and appearance of lochia is often similar to a menstrual period. °? Over the next few weeks, it will gradually decrease to a dry, yellow-brown discharge. °? For most women, lochia stops completely by 4-6 weeks after delivery. Vaginal bleeding can vary from woman to woman. °· Change your sanitary pads frequently. Watch for any changes in your flow, such as: °? A sudden increase in volume. °? A change in color. °? Large blood clots. °· If you pass a blood clot from your vagina, save it and call your health care provider to discuss. Do not flush blood clots down the toilet before talking with your health care provider. °· Do not use tampons or douches until your health care provider says this is safe. °· If you are not breastfeeding, your period should return 6-8 weeks after delivery. If you are feeding your child breast milk only (exclusive breastfeeding), your period may not return until you stop breastfeeding. °Perineal care °· Keep the area between the vagina and the anus (perineum) clean and dry as told by your health care provider. Use medicated pads and pain-relieving sprays and creams as directed. °· If you had a cut in the perineum (episiotomy) or a tear in the vagina, check the area for signs of infection until you are healed. Check for: °? More redness, swelling, or pain. °? Fluid or blood coming from the cut or tear. °? Warmth. °? Pus or a bad  smell. °· You may be given a squirt bottle to use instead of wiping to clean the perineum area after you go to the bathroom. As you start healing, you may use the squirt bottle before wiping yourself. Make sure to wipe gently. °· To relieve pain caused by an episiotomy, a tear in the vagina, or swollen veins in the anus (hemorrhoids), try taking a warm sitz bath 2-3 times a day. A sitz bath is a warm water bath that is taken while you are sitting down. The water should only come up to your hips and should cover your buttocks. °Breast care °· Within the first few days after delivery, your breasts may feel heavy, full, and uncomfortable (breast engorgement). Milk may also leak from your breasts. Your health care provider can suggest ways to help relieve the discomfort. Breast engorgement should go away within a few days. °· If you are breastfeeding: °? Wear a bra that supports your breasts and fits you well. °? Keep your nipples clean and dry. Apply creams and ointments as told by your health care provider. °? You may need to use breast pads to absorb milk that leaks from your breasts. °? You may have uterine contractions every time you breastfeed for up to several weeks after delivery. Uterine contractions help your uterus return to its normal size. °? If you have any problems with breastfeeding, work with your health care provider or lactation consultant. °· If you are not breastfeeding: °? Avoid touching your breasts a lot. Doing this can make   your breasts produce more milk. °? Wear a good-fitting bra and use cold packs to help with swelling. °? Do not squeeze out (express) milk. This causes you to make more milk. °Intimacy and sexuality °· Ask your health care provider when you can engage in sexual activity. This may depend on: °? Your risk of infection. °? How fast you are healing. °? Your comfort and desire to engage in sexual activity. °· You are able to get pregnant after delivery, even if you have not had  your period. If desired, talk with your health care provider about methods of birth control (contraception). °Medicines °· Take over-the-counter and prescription medicines only as told by your health care provider. °· If you were prescribed an antibiotic medicine, take it as told by your health care provider. Do not stop taking the antibiotic even if you start to feel better. °Activity °· Gradually return to your normal activities as told by your health care provider. Ask your health care provider what activities are safe for you. °· Rest as much as possible. Try to rest or take a nap while your baby is sleeping. °Eating and drinking ° °· Drink enough fluid to keep your urine pale yellow. °· Eat high-fiber foods every day. These may help prevent or relieve constipation. High-fiber foods include: °? Whole grain cereals and breads. °? Brown rice. °? Beans. °? Fresh fruits and vegetables. °· Do not try to lose weight quickly by cutting back on calories. °· Take your prenatal vitamins until your postpartum checkup or until your health care provider tells you it is okay to stop. °Lifestyle °· Do not use any products that contain nicotine or tobacco, such as cigarettes and e-cigarettes. If you need help quitting, ask your health care provider. °· Do not drink alcohol, especially if you are breastfeeding. °General instructions °· Keep all follow-up visits for you and your baby as told by your health care provider. Most women visit their health care provider for a postpartum checkup within the first 3-6 weeks after delivery. °Contact a health care provider if: °· You feel unable to cope with the changes that your child brings to your life, and these feelings do not go away. °· You feel unusually sad or worried. °· Your breasts become red, painful, or hard. °· You have a fever. °· You have trouble holding urine or keeping urine from leaking. °· You have little or no interest in activities you used to enjoy. °· You have not  breastfed at all and you have not had a menstrual period for 12 weeks after delivery. °· You have stopped breastfeeding and you have not had a menstrual period for 12 weeks after you stopped breastfeeding. °· You have questions about caring for yourself or your baby. °· You pass a blood clot from your vagina. °Get help right away if: °· You have chest pain. °· You have difficulty breathing. °· You have sudden, severe leg pain. °· You have severe pain or cramping in your lower abdomen. °· You bleed from your vagina so much that you fill more than one sanitary pad in one hour. Bleeding should not be heavier than your heaviest period. °· You develop a severe headache. °· You faint. °· You have blurred vision or spots in your vision. °· You have bad-smelling vaginal discharge. °· You have thoughts about hurting yourself or your baby. °If you ever feel like you may hurt yourself or others, or have thoughts about taking your own life, get help   right away. You can go to the nearest emergency department or call:  Your local emergency services (911 in the [place]  A suicide crisis helpline, such as the National Suicide Prevention Lifeline at (804)878-9471. This is open 24 hours a day. Summary  The period of time right after you deliver your newborn up to 6-12 weeks after delivery is called the postpartum period.  Gradually return to your normal activities as told by your health care provider.  Keep all follow-up visits for you and your baby as told by your health care provider. This information is not intended to replace advice given to you by your health care provider. Make sure you discuss any questions you have with your health care provider. Document Revised: 09/14/2017 Document Reviewed: 06/25/2017 Elsevier Patient Education  2020 [employer]. Breastfeeding Tips for a Good Latch Latching is how your baby's mouth attaches to your nipple to breastfeed. It is an important part of breastfeeding. Your baby  may have trouble latching for a number of reasons. A poor latch may cause you to have cracked or sore nipples or other problems. Follow these instructions at home: How to position your baby  Find a comfortable place to sit or lie down. Your neck and back should be well supported.  If you are seated, place a pillow or rolled-up blanket under your baby. This will bring him or her to the level of your breast.  Make sure that your baby's belly (abdomen) is facing your belly.  Try different positions to find one that works best for you and your baby. How to help your baby latch   To start, gently rub your breast. Move your fingertips in a circle as you massage from your chest wall toward your nipple. This helps milk flow. Keep doing this during feeding if needed.  Position your breast. Hold your breast with four fingers underneath and your thumb above your nipple. Keep your fingers away from your nipple and your baby's mouth. Follow these steps to help your baby latch: 1. Rub your baby's lips gently with your finger or nipple. 2. When your baby's mouth is open wide enough, quickly bring your baby to your breast and place your whole nipple into your baby's mouth. Place as much of the colored area around your nipple (areola)as possible into your baby's mouth. 3. Your baby's tongue should be between his or her lower gum and your breast. 4. You should be able to see more areola above your baby's upper lip than below the lower lip. 5. When your baby starts sucking, you will feel a gentle pull on your nipple. You should not feel any pain. Be patient. It is common for a baby to suck for about 2-3 minutes to start the flow of breast milk. 6. Make sure that your baby's mouth is in the right position around your nipple. Your baby's lips should make a seal on your breast and be turned outward.  General instructions  Look for these signs that your baby has latched on to your nipple: ? The baby is quietly  tugging or sucking without causing you pain. ? You hear the baby swallow after every 3 or 4 sucks. ? You see movement above and in front of the baby's ears while he or she is sucking.  Be aware of these signs that your baby has not latched on to your nipple: ? The baby makes sucking sounds or smacking sounds while feeding. ? You have nipple pain.  If your  baby is not latched well, put your little finger between your baby's gums and your nipple. This will break the seal. Then try to help your baby latch again.  If you keep having problems, get help from a breastfeeding specialist (lactation consultant). Contact a doctor if:  You have cracking or soreness in your nipples that lasts longer than 1 week.  You have nipple pain.  Your breasts are filled with too much milk (engorgement), and this does not improve after 48-72 hours.  You have a plugged milk duct and a fever.  You follow the tips for a good latch but you keep having problems or concerns.  You have a pus-like fluid coming from your breast.  Your baby is not gaining weight.  Your baby loses weight. Summary  Latching is how your baby's mouth attaches to your nipple to breastfeed.  Try different positions for breastfeeding to find one that works best for you and your baby.  A poor latch may cause you to have cracked or sore nipples or other problems. This information is not intended to replace advice given to you by your health care provider. Make sure you discuss any questions you have with your health care provider. Document Revised: 01/01/2019 Document Reviewed: 04/18/2017 Elsevier Patient Education  2020 [employer] Inc[employer]  

## 2020-08-06 NOTE — Progress Notes (Signed)
Pt discharged via wheelchair in the company of significant other and baby. Discharge instructions reviewed with patient. IV d/c'd on previous shift.

## 2020-08-06 NOTE — Lactation Note (Addendum)
This note was copied from a baby's chart. Lactation Consultation Note  Patient Name: Marissa Herman ASNKN'L Date: 08/06/2020 Reason for consult: Initial assessment;Term   Maternal Data Formula Feeding for Exclusion: No Has patient been taught Hand Expression?: Yes Does the patient have breastfeeding experience prior to this delivery?: Yes  Feeding Feeding Type: Breast Fed Mom states baby latches easily to left breast but struggles on right, right nipple sore, assisted with latching baby to right breast in football hold, skin to skin, baby latches easily and is nursing well with little stimulation, swallows heard, mom states that there is less nipple pain in this position, shown how to flange lips and get deep latch  LATCH Score Latch: Grasps breast easily, tongue down, lips flanged, rhythmical sucking.  Audible Swallowing: Spontaneous and intermittent  Type of Nipple: Everted at rest and after stimulation  Comfort (Breast/Nipple): Filling, red/small blisters or bruises, mild/mod discomfort  Hold (Positioning): Assistance needed to correctly position infant at breast and maintain latch.  LATCH Score: 8  Interventions Interventions: Breast feeding basics reviewed;Assisted with latch;Skin to skin;Hand express;Breast compression;Adjust position;Support pillows;Position options;Coconut oil  Lactation Tools Discussed/Used WIC Program: No Breastfeeding resources and la [employer] given, [facility] name and no written on white board  Consult Status Consult Status: PRN    Dyann Kief 08/06/2020, 3:33 PM

## 2020-08-06 NOTE — Anesthesia Postprocedure Evaluation (Signed)
Anesthesia Post Note  Patient: [place]  Procedure(s) Performed: AN AD HOC LABOR EPIDURAL  Patient location during evaluation: Mother Baby Anesthesia Type: Epidural Level of consciousness: oriented and awake and alert Pain management: pain level controlled Vital Signs Assessment: post-procedure vital signs reviewed and stable Respiratory status: spontaneous breathing and respiratory function stable Cardiovascular status: blood pressure returned to baseline and stable Postop Assessment: no headache, no backache, no apparent nausea or vomiting and able to ambulate Anesthetic complications: no   No complications documented.   Last Vitals:  Vitals:   08/06/20 0410 08/06/20 0731  BP: 115/72 125/82  Pulse: 70 77  Resp: 18 18  Temp: 36.9 C 37.1 C  SpO2: 96% 97%    Last Pain:  Vitals:   08/06/20 0731  TempSrc: Oral  PainSc:                  Starling Manns

## 2020-08-07 ENCOUNTER — Encounter
Admission: EM | Disposition: A | Discharge status: Home / Self Care | Payer: Self-pay | Source: Home / Self Care | Attending: Obstetrics

## 2020-08-07 SURGERY — LIGATION, FALLOPIAN TUBE, POSTPARTUM
Anesthesia: Choice

## 2020-08-08 NOTE — Anesthesia Postprocedure Evaluation (Signed)
Anesthesia Post Note  Patient: [place]  Procedure(s) Performed: SUTURE REPAIR PERINEAL LACERATION (N/A )  Anesthesia Type: Epidural Anesthetic complications: no Comments: See post op note for epidural    No complications documented.   Last Vitals:  Vitals:   08/06/20 1151 08/06/20 1653  BP: 110/70 107/77  Pulse: 72 72  Resp: 20 18  Temp: 36.9 C 36.7 C  SpO2:      Last Pain:  Vitals:   08/06/20 1952  TempSrc:   PainSc: 0-No pain                 Cleda Mccreedy Karmel Patricelli

## 2020-08-09 ENCOUNTER — Telehealth: Payer: Self-pay

## 2020-08-09 NOTE — Telephone Encounter (Signed)
Patient delivered on Thursday. Inquiring on status of FMLA papers. She also has some additional forms for shared leave. Her employer doesn't do paid parental leave. She's getting time donated to her and she's needing some information for that. OV#[phone]

## 2020-08-10 ENCOUNTER — Telehealth: Payer: Self-pay

## 2020-08-10 NOTE — Telephone Encounter (Signed)
FMLA/DISABILITY forms (2) for [employer] filled out, signature obtained and given to [facility] for processing.

## 2020-08-10 NOTE — Telephone Encounter (Signed)
Called and left voicemail for patient to call back about FMLA paperwork payment

## 2020-08-13 ENCOUNTER — Telehealth: Payer: Self-pay

## 2020-08-13 NOTE — Telephone Encounter (Signed)
Patient states the "shared leave " portion of her fmla has to be turned in to her job by herself. Please call pt once signature is received so she can pick up.

## 2020-08-16 NOTE — Telephone Encounter (Signed)
Pt aware that forms are ready for pick up

## 2020-08-17 ENCOUNTER — Other Ambulatory Visit: Payer: Self-pay | Admitting: Obstetrics and Gynecology

## 2020-08-17 ENCOUNTER — Telehealth: Payer: Self-pay | Admitting: Obstetrics and Gynecology

## 2020-08-17 NOTE — Telephone Encounter (Signed)
I will order this patient an [place] to be done at the hospital. Please call and schedule the patient for a visit with me next week. Okay to double book. Thank you

## 2020-08-17 NOTE — Telephone Encounter (Signed)
Patient is returning phone call.  °

## 2020-08-18 ENCOUNTER — Other Ambulatory Visit: Payer: Self-pay

## 2020-08-18 ENCOUNTER — Other Ambulatory Visit
Admission: RE | Admit: 2020-08-18 | Discharge: 2020-08-18 | Disposition: A | Discharge status: Home / Self Care | Payer: BC Managed Care – PPO | Source: Ambulatory Visit | Attending: Obstetrics and Gynecology | Admitting: Obstetrics and Gynecology

## 2020-08-18 ENCOUNTER — Ambulatory Visit
Admission: RE | Admit: 2020-08-18 | Discharge: 2020-08-18 | Disposition: A | Discharge status: Home / Self Care | Payer: BC Managed Care – PPO | Source: Ambulatory Visit | Attending: Obstetrics and Gynecology | Admitting: Obstetrics and Gynecology

## 2020-08-18 DIAGNOSIS — Z87891 Personal history of nicotine dependence: Secondary | ICD-10-CM | POA: Diagnosis not present

## 2020-08-18 DIAGNOSIS — Z01812 Encounter for preprocedural laboratory examination: Secondary | ICD-10-CM | POA: Insufficient documentation

## 2020-08-18 DIAGNOSIS — Z20822 Contact with and (suspected) exposure to covid-19: Secondary | ICD-10-CM | POA: Insufficient documentation

## 2020-08-18 DIAGNOSIS — Z79899 Other long term (current) drug therapy: Secondary | ICD-10-CM | POA: Diagnosis not present

## 2020-08-18 LAB — SARS CORONAVIRUS 2 (TAT 6-24 HRS): SARS Coronavirus 2: NEGATIVE

## 2020-08-18 LAB — CBC
HCT: 34 % — ABNORMAL LOW (ref 36.0–46.0)
Hemoglobin: 11.1 g/dL — ABNORMAL LOW (ref 12.0–15.0)
MCH: 29.3 pg (ref 26.0–34.0)
MCHC: 32.6 g/dL (ref 30.0–36.0)
MCV: 89.7 fL (ref 80.0–100.0)
Platelets: 304 10*3/uL (ref 150–400)
RBC: 3.79 MIL/uL — ABNORMAL LOW (ref 3.87–5.11)
RDW: 13 % (ref 11.5–15.5)
WBC: 10.1 10*3/uL (ref 4.0–10.5)
nRBC: 0 % (ref 0.0–0.2)

## 2020-08-18 LAB — TYPE AND SCREEN
ABO/RH(D): O POS
Antibody Screen: NEGATIVE
Extend sample reason: UNDETERMINED

## 2020-08-18 NOTE — Telephone Encounter (Signed)
Patient is schedule for 08/18/20 at 10 am in [facility] for ultrasound and 08/23/20 with [facility]

## 2020-08-20 ENCOUNTER — Ambulatory Visit: Payer: BC Managed Care – PPO | Admitting: Certified Registered Nurse Anesthetist

## 2020-08-20 ENCOUNTER — Encounter: Payer: Self-pay | Admitting: Obstetrics and Gynecology

## 2020-08-20 ENCOUNTER — Ambulatory Visit
Admission: RE | Admit: 2020-08-20 | Discharge: 2020-08-20 | Disposition: A | Discharge status: Home / Self Care | Payer: BC Managed Care – PPO | Attending: Obstetrics and Gynecology | Admitting: Obstetrics and Gynecology

## 2020-08-20 ENCOUNTER — Encounter
Admission: RE | Disposition: A | Discharge status: Home / Self Care | Payer: Self-pay | Source: Home / Self Care | Attending: Obstetrics and Gynecology

## 2020-08-20 ENCOUNTER — Other Ambulatory Visit: Payer: Self-pay

## 2020-08-20 DIAGNOSIS — Z20822 Contact with and (suspected) exposure to covid-19: Secondary | ICD-10-CM | POA: Insufficient documentation

## 2020-08-20 DIAGNOSIS — Z79899 Other long term (current) drug therapy: Secondary | ICD-10-CM | POA: Insufficient documentation

## 2020-08-20 DIAGNOSIS — Z87891 Personal history of nicotine dependence: Secondary | ICD-10-CM | POA: Insufficient documentation

## 2020-08-20 HISTORY — PX: DILATION AND CURETTAGE OF UTERUS: SHX78

## 2020-08-20 SURGERY — DILATION AND CURETTAGE
Anesthesia: General

## 2020-08-20 MED ORDER — LACTATED RINGERS IV SOLN: INTRAVENOUS | Status: DC

## 2020-08-20 MED ORDER — ONDANSETRON HCL 4 MG/2ML IJ SOLN
INTRAMUSCULAR | Status: AC
  Filled 2020-08-20: qty 2

## 2020-08-20 MED ORDER — TRAMADOL HCL 50 MG PO TABS
ORAL_TABLET | ORAL | Status: AC
  Filled 2020-08-20: qty 1

## 2020-08-20 MED ORDER — FENTANYL CITRATE (PF) 100 MCG/2ML IJ SOLN
INTRAMUSCULAR | Status: DC | PRN
  Administered 2020-08-20 (×2): 25 ug via INTRAVENOUS

## 2020-08-20 MED ORDER — TRAMADOL HCL 50 MG PO TABS
50.0000 mg | ORAL_TABLET | Freq: Four times a day (QID) | ORAL | 0 refills | Status: DC | PRN
Start: 2020-08-20 — End: 2020-09-25

## 2020-08-20 MED ORDER — ACETAMINOPHEN 10 MG/ML IV SOLN
INTRAVENOUS | Status: DC | PRN
  Administered 2020-08-20: 1000 mg via INTRAVENOUS

## 2020-08-20 MED ORDER — POVIDONE-IODINE 10 % EX SWAB: 2.0000 "application " | Freq: Once | CUTANEOUS | Status: DC

## 2020-08-20 MED ORDER — MIDAZOLAM HCL 2 MG/2ML IJ SOLN
INTRAMUSCULAR | Status: AC
  Filled 2020-08-20: qty 2

## 2020-08-20 MED ORDER — DEXAMETHASONE SODIUM PHOSPHATE 10 MG/ML IJ SOLN
INTRAMUSCULAR | Status: DC | PRN
  Administered 2020-08-20: 10 mg via INTRAVENOUS

## 2020-08-20 MED ORDER — PROPOFOL 10 MG/ML IV BOLUS
INTRAVENOUS | Status: DC | PRN
  Administered 2020-08-20: 200 mg via INTRAVENOUS

## 2020-08-20 MED ORDER — FENTANYL CITRATE (PF) 100 MCG/2ML IJ SOLN
INTRAMUSCULAR | Status: AC
  Filled 2020-08-20: qty 2

## 2020-08-20 MED ORDER — PROPOFOL 10 MG/ML IV BOLUS
INTRAVENOUS | Status: AC
  Filled 2020-08-20: qty 20

## 2020-08-20 MED ORDER — ACETAMINOPHEN 10 MG/ML IV SOLN
INTRAVENOUS | Status: AC
  Filled 2020-08-20: qty 100

## 2020-08-20 MED ORDER — LIDOCAINE HCL (CARDIAC) PF 100 MG/5ML IV SOSY
PREFILLED_SYRINGE | INTRAVENOUS | Status: DC | PRN
  Administered 2020-08-20: 60 mg via INTRAVENOUS

## 2020-08-20 MED ORDER — CHLORHEXIDINE GLUCONATE 0.12 % MT SOLN
OROMUCOSAL | Status: AC
  Administered 2020-08-20: 15 mL via OROMUCOSAL
  Filled 2020-08-20: qty 15

## 2020-08-20 MED ORDER — LIDOCAINE HCL (PF) 2 % IJ SOLN
INTRAMUSCULAR | Status: AC
  Filled 2020-08-20: qty 5

## 2020-08-20 MED ORDER — ONDANSETRON HCL 4 MG/2ML IJ SOLN: 4.0000 mg | Freq: Once | INTRAMUSCULAR | Status: DC | PRN

## 2020-08-20 MED ORDER — TRAMADOL HCL 50 MG PO TABS
50.0000 mg | ORAL_TABLET | Freq: Once | ORAL | Status: AC
  Administered 2020-08-20: 50 mg via ORAL

## 2020-08-20 MED ORDER — FENTANYL CITRATE (PF) 100 MCG/2ML IJ SOLN: 25.0000 ug | INTRAMUSCULAR | Status: DC | PRN

## 2020-08-20 MED ORDER — ONDANSETRON HCL 4 MG/2ML IJ SOLN
INTRAMUSCULAR | Status: DC | PRN
  Administered 2020-08-20: 4 mg via INTRAVENOUS

## 2020-08-20 MED ORDER — EPHEDRINE 5 MG/ML INJ
INTRAVENOUS | Status: AC
  Filled 2020-08-20: qty 10

## 2020-08-20 MED ORDER — MIDAZOLAM HCL 2 MG/2ML IJ SOLN
INTRAMUSCULAR | Status: DC | PRN
  Administered 2020-08-20: 2 mg via INTRAVENOUS

## 2020-08-20 MED ORDER — CHLORHEXIDINE GLUCONATE 0.12 % MT SOLN: 15.0000 mL | Freq: Once | OROMUCOSAL | Status: AC

## 2020-08-20 MED ORDER — ORAL CARE MOUTH RINSE: 15.0000 mL | Freq: Once | OROMUCOSAL | Status: AC

## 2020-08-20 MED ORDER — EPHEDRINE SULFATE 50 MG/ML IJ SOLN
INTRAMUSCULAR | Status: DC | PRN
  Administered 2020-08-20 (×2): 10 mg via INTRAVENOUS

## 2020-08-20 SURGICAL SUPPLY — 24 items
BAG COUNTER SPONGE EZ (MISCELLANEOUS) IMPLANT
BAG SPNG 4X4 CLR HAZ (MISCELLANEOUS)
CATH ROBINSON RED A/P 16FR (CATHETERS) IMPLANT
DRSG TELFA 3X8 NADH (GAUZE/BANDAGES/DRESSINGS) ×8 IMPLANT
FILTER UTR ASPR SPEC (MISCELLANEOUS) IMPLANT
FLTR UTR ASPR SPEC (MISCELLANEOUS)
GLOVE BIOGEL PI IND STRL 6.5 (GLOVE) ×2 IMPLANT
GLOVE BIOGEL PI INDICATOR 6.5 (GLOVE) ×2
GLOVE SURG SYN 6.5 ES PF (GLOVE) ×4 IMPLANT
GOWN STRL REUS W/ TWL LRG LVL3 (GOWN DISPOSABLE) ×2 IMPLANT
GOWN STRL REUS W/TWL LRG LVL3 (GOWN DISPOSABLE) ×4
KIT BERKELEY 1ST TRIMESTER 3/8 (MISCELLANEOUS) IMPLANT
KIT TURNOVER CYSTO (KITS) ×2 IMPLANT
MANIFOLD NEPTUNE II (INSTRUMENTS) ×2 IMPLANT
NS IRRIG 500ML POUR BTL (IV SOLUTION) ×2 IMPLANT
PACK DNC HYST (MISCELLANEOUS) ×2 IMPLANT
PAD OB MATERNITY 4.3X12.25 (PERSONAL CARE ITEMS) IMPLANT
PAD PREP 24X41 OB/GYN DISP (PERSONAL CARE ITEMS) ×2 IMPLANT
SET BERKELEY SUCTION TUBING (SUCTIONS) ×2 IMPLANT
TOWEL OR 17X26 4PK STRL BLUE (TOWEL DISPOSABLE) ×2 IMPLANT
VACURETTE 10 RIGID CVD (CANNULA) ×2 IMPLANT
VACURETTE 12 RIGID CVD (CANNULA) IMPLANT
VACURETTE 8 RIGID CVD (CANNULA) ×2 IMPLANT
VACURETTE 8MM F TIP (MISCELLANEOUS) IMPLANT

## 2020-08-20 NOTE — Transfer of Care (Signed)
Immediate Anesthesia Transfer of Care Note  Patient: Marissa Herman  Procedure(s) Performed: DILATATION AND CURETTAGE (N/A )  Patient Location: PACU  Anesthesia Type:General  Level of Consciousness: drowsy  Airway & Oxygen Therapy: Patient Spontanous Breathing and Patient connected to face mask oxygen  Post-op Assessment: Report given to RN and Post -op Vital signs reviewed and stable  Post vital signs: Reviewed and stable  Last Vitals:  Vitals Value Taken Time  BP 122/73 08/20/20 0833  Temp    Pulse 85 08/20/20 0834  Resp 15 08/20/20 0834  SpO2 100 % 08/20/20 0834  Vitals shown include unvalidated device data.  Last Pain:  Vitals:   08/20/20 0832  TempSrc:   PainSc: (P) Asleep      Patients Stated Pain Goal: 0 (08/20/20 0631)  Complications: No complications documented.

## 2020-08-20 NOTE — Discharge Instructions (Signed)

## 2020-08-20 NOTE — H&P (Signed)
Marissa Herman is an 29 y.o. female.   Chief Complaint: abnormal postpartum bleeding HPI: Patient presented with complaints of gushing of blood. Multiple episodes of bleeding in heavy bursts since vaginal delivery on 08/05/2020. Pelvic US showed retained products of conceptions.   Past Medical History:  Diagnosis Date  . History of abnormal cervical Pap smear 09/10/2016   LGSIL/ biopsy +HPV changes  . Medical history non-contributory     Past Surgical History:  Procedure Laterality Date  . COLPOSCOPY  11/10/2016   [facility] LSIL  . PERINEAL LACERATION REPAIR N/A 08/05/2020   Procedure: SUTURE REPAIR PERINEAL LACERATION;  Surgeon: Natale Milch, MD;  Location: [facility] ORS;  Service: Gynecology;  Laterality: N/A;  . WISDOM TOOTH EXTRACTION      Family History  Problem Relation Age of Onset  . Diabetes Father   . Heart disease Maternal Grandmother   . Hypertension Maternal Grandmother   . Stroke Maternal Grandmother   . Heart disease Paternal Grandfather        Had heart attack   Social History:  reports that she has quit smoking. Her smoking use included cigarettes. She has never used smokeless tobacco. She reports that she does not drink alcohol and does not use drugs.  Allergies:  Allergies  Allergen Reactions  . Other Rash    Nitrile gloves prolong exposure    Medications Prior to Admission  Medication Sig Dispense Refill  . docusate sodium (COLACE) 100 MG capsule Take 100 mg by mouth 2 (two) times daily.     . ferrous sulfate 325 (65 FE) MG tablet Take 1 tablet (325 mg total) by mouth 2 (two) times daily with a meal. 60 tablet 0  . ibuprofen (ADVIL) 600 MG tablet Take 1 tablet (600 mg total) by mouth every 6 (six) hours. (Patient taking differently: Take 600 mg by mouth in the morning and at bedtime. ) 30 tablet 0  . omeprazole (PRILOSEC) 20 MG capsule Take 1 capsule (20 mg total) by mouth daily. 30 capsule 11  . Prenatal MV-Min-FA-Omega-3 (PRENATAL  GUMMIES/DHA & FA PO) Take 1 tablet by mouth.      Results for orders placed or performed during the hospital encounter of 08/18/20 (from the past 48 hour(s))  SARS CORONAVIRUS 2 (TAT 6-24 HRS) Nasopharyngeal Nasopharyngeal Swab     Status: None   Collection Time: 08/18/20 12:57 PM   Specimen: Nasopharyngeal Swab  Result Value Ref Range   SARS Coronavirus 2 NEGATIVE NEGATIVE    Comment: (NOTE) SARS-CoV-2 target nucleic acids are NOT DETECTED.  The SARS-CoV-2 RNA is generally detectable in upper and lower respiratory specimens during the acute phase of infection. Negative results do not preclude SARS-CoV-2 infection, do not rule out co-infections with other pathogens, and should not be used as the sole basis for treatment or other patient management decisions. Negative results must be combined with clinical observations, patient history, and epidemiological information. The expected result is Negative.  Fact Sheet for Patients: HairSlick.no  Fact Sheet for Healthcare Providers: quierodirigir.com  This test is not yet approved or cleared by the [place] FDA and  has been authorized for detection and/or diagnosis of SARS-CoV-2 by FDA under an Emergency Use Authorization (EUA). This EUA will remain  in effect (meaning this test can be used) for the duration of the COVID-19 declaration under Se ction 564(b)(1) of the Act, 21 U.S.C. section 360bbb-3(b)(1), unless the authorization is terminated or revoked sooner.  Performed at [facility], [address],  [place], [place] [address]   CBC     Status: Abnormal   Collection Time: 08/18/20 12:57 PM  Result Value Ref Range   WBC 10.1 4.0 - 10.5 K/uL   RBC 3.79 (L) 3.87 - 5.11 MIL/uL   Hemoglobin 11.1 (L) 12.0 - 15.0 g/dL   HCT 80.9 (L) 36 - 46 %   MCV 89.7 80.0 - 100.0 fL   MCH 29.3 26.0 - 34.0 pg   MCHC 32.6 30.0 - 36.0 g/dL   RDW 98.3 38.2 - 50.5 %   Platelets 304  150 - 400 K/uL   nRBC 0.0 0.0 - 0.2 %    Comment: Performed at [facility], [address] [place], [place] [address]  Type and screen [facility]     Status: None   Collection Time: 08/18/20 12:57 PM  Result Value Ref Range   ABO/RH(D) O POS    Antibody Screen NEG    Sample Expiration 08/21/2020,2359    Extend sample reason      PREGNANT WITHIN 3 MONTHS, UNABLE TO EXTEND Performed at [facility], [address] [place], [place] [address]    US Pelvis Complete  Result Date: 08/18/2020 CLINICAL DATA:  Vaginal delivery 08/05/2020, postpartum bleeding EXAM: TRANSABDOMINAL ULTRASOUND OF PELVIS TECHNIQUE: Transabdominal ultrasound examination of the pelvis was performed including evaluation of the uterus, ovaries, adnexal regions, and pelvic cul-de-sac. COMPARISON:  None. FINDINGS: Uterus Measurements: 9.7 x 9.5 x 5.7 cm = volume: 273 mL. No fibroids or other mass visualized. Endometrium Heterogeneous debris within the endometrial cavity. Overall thickness (including debris) measures 24 mm. Mild peripheral vascularity. Right ovary Not discretely visualized.  No adnexal mass is seen. Left ovary Measurements: 2.8 x 2.0 x 1.9 cm = volume: 5.4 mL. Normal appearance/no adnexal mass. Other findings:  No abnormal free fluid. IMPRESSION: Heterogeneous debris within the endometrial cavity, measuring up to 24 mm. Mild peripheral vascularity. This appearance is nonspecific but raises concern for retained products of conception in this clinical context. Electronically Signed   By: Charline Bills M.D.   On: 08/18/2020 11:21    Review of Systems  Constitutional: Negative for chills and fever.  HENT: Negative for congestion, hearing loss and sinus pain.   Respiratory: Negative for cough, shortness of breath and wheezing.   Cardiovascular: Negative for chest pain, palpitations and leg swelling.  Gastrointestinal: Negative for abdominal pain, constipation, diarrhea,  nausea and vomiting.  Genitourinary: Negative for dysuria, flank pain, frequency, hematuria and urgency.  Musculoskeletal: Negative for back pain.  Skin: Negative for rash.  Neurological: Negative for dizziness and headaches.  Psychiatric/Behavioral: Negative for suicidal ideas. The patient is not nervous/anxious.     Blood pressure 126/82, pulse 83, temperature 98.2 F (36.8 C), temperature source Temporal, resp. rate 16, height 5' 5.5" (1.664 m), weight 86.2 kg, SpO2 98 %, unknown if currently breastfeeding. Physical Exam Vitals and nursing note reviewed.  Constitutional:      Appearance: She is well-developed.  HENT:     Head: Normocephalic and atraumatic.  Cardiovascular:     Rate and Rhythm: Normal rate and regular rhythm.  Pulmonary:     Effort: Pulmonary effort is normal.     Breath sounds: Normal breath sounds.  Abdominal:     General: Bowel sounds are normal.     Palpations: Abdomen is soft.  Musculoskeletal:        General: Normal range of motion.  Skin:    General: Skin is warm and dry.  Neurological:  Mental Status: She is alert and oriented to person, place, and time.  Psychiatric:        Behavior: Behavior normal.        Thought Content: Thought content normal.        Judgment: Judgment normal.      Assessment/Plan 29 yo G2P2002 15 days postpartum.  Retaiend products of conception. Will proceed with Dilation and evacuation. Discussed risk of bleeding, infection, and damage to surrounding pelvic organs. All questions answered and patient consented to the procedure.     Natale Milch, MD 08/20/2020, 7:18 AM

## 2020-08-20 NOTE — Op Note (Signed)
  Operative Note  08/20/2020 8:31 AM  PRE-OP DIAGNOSIS: Retained POC   POST-OP DIAGNOSIS: same  SURGEON: Shadae Reino MD  ANESTHESIA: Choice   PROCEDURE: Procedure(s): DILATATION AND CURETTAGE   ESTIMATED BLOOD LOSS: 100 mL   SPECIMENS: POC   COMPLICATIONS: none  DISPOSITION: PACU - hemodynamically stable.  CONDITION: stable  FINDINGS: Exam under anesthesia revealed a 15 wk size uterus without palpable adnexal masses.   INDICATION FOR PROCEDURE: Retained products of conception  PROCEDURE IN DETAIL: After informed consent was obtained, the patient was taken to the operating room where anesthesia was obtained without difficulty. The patient was positioned in the dorsal lithotomy position with [employer]. Time out was performed and an exam under anesthesia was performed. The vagina, perineum, and lower abdomen were prepped and draped in a normal sterile fashion. The bladder was emptied with an I&O catheter. A speculum was placed into the vagina and the cervix was grasped with a single toothed tenaculum.  The cervix was patent. A  10 curved suction cannula was advanced into the uterine cavity. The suction was activated and the contents of the uterus were aspirated until no further tissue was obtained. A 7 flexible suction cannula was also used in the same fashion.  The uterus was then curetted to gritty texture throughout.  At the end of the procedure bleeding was noted to beMinimal.  All instruments were then removed from the vagina.The patient tolerated the procedure well. All sponge, instrument, and needle counts were correct. The patient was taken to the recovery room in good condition.   Adelene Idler MD [facility], [facility] 08/20/2020 8:31 AM

## 2020-08-20 NOTE — Anesthesia Preprocedure Evaluation (Signed)
Anesthesia Evaluation  Patient identified by MRN, date of birth, ID band Patient awake    Reviewed: Allergy & Precautions, H&P , NPO status , Patient's Chart, lab work & pertinent test results, reviewed documented beta blocker date and time   Airway Mallampati: II  TM Distance: >3 FB Neck ROM: full    Dental  (+) Teeth Intact   Pulmonary neg pulmonary ROS, former smoker,    Pulmonary exam normal        Cardiovascular Exercise Tolerance: Good negative cardio ROS Normal cardiovascular exam Rate:Normal     Neuro/Psych  Headaches, negative psych ROS   GI/Hepatic Neg liver ROS, GERD  Medicated,  Endo/Other  negative endocrine ROS  Renal/GU negative Renal ROS  negative genitourinary   Musculoskeletal   Abdominal   Peds  Hematology negative hematology ROS (+)   Anesthesia Other Findings   Reproductive/Obstetrics negative OB ROS                             Anesthesia Physical Anesthesia Plan  ASA: II  Anesthesia Plan: General LMA   Post-op Pain Management:    Induction:   PONV Risk Score and Plan: 4 or greater  Airway Management Planned:   Additional Equipment:   Intra-op Plan:   Post-operative Plan:   Informed Consent: I have reviewed the patients History and Physical, chart, labs and discussed the procedure including the risks, benefits and alternatives for the proposed anesthesia with the patient or authorized representative who has indicated his/her understanding and acceptance.       Plan Discussed with: CRNA  Anesthesia Plan Comments:         Anesthesia Quick Evaluation

## 2020-08-20 NOTE — Anesthesia Procedure Notes (Signed)
Procedure Name: LMA Insertion Date/Time: 08/20/2020 7:34 AM Performed by: Hezzie Bump, CRNA Pre-anesthesia Checklist: Patient identified, Patient being monitored, Timeout performed, Emergency Drugs available and Suction available Patient Re-evaluated:Patient Re-evaluated prior to induction Oxygen Delivery Method: Circle system utilized Preoxygenation: Pre-oxygenation with 100% oxygen Induction Type: IV induction Ventilation: Mask ventilation without difficulty LMA: LMA inserted LMA Size: 3.5 Tube type: Oral Number of attempts: 1 Placement Confirmation: positive ETCO2 and breath sounds checked- equal and bilateral Tube secured with: Tape Dental Injury: Teeth and Oropharynx as per pre-operative assessment

## 2020-08-23 ENCOUNTER — Other Ambulatory Visit: Payer: Self-pay

## 2020-08-23 ENCOUNTER — Ambulatory Visit (INDEPENDENT_AMBULATORY_CARE_PROVIDER_SITE_OTHER): Payer: BC Managed Care – PPO | Admitting: Obstetrics and Gynecology

## 2020-08-23 ENCOUNTER — Encounter: Payer: Self-pay | Admitting: Obstetrics and Gynecology

## 2020-08-23 DIAGNOSIS — Z3009 Encounter for other general counseling and advice on contraception: Secondary | ICD-10-CM

## 2020-08-23 NOTE — Anesthesia Postprocedure Evaluation (Signed)
Anesthesia Post Note  Patient: [place]  Procedure(s) Performed: DILATATION AND CURETTAGE (N/A )  Patient location during evaluation: PACU Anesthesia Type: General Level of consciousness: awake and alert Pain management: pain level controlled Vital Signs Assessment: post-procedure vital signs reviewed and stable Respiratory status: spontaneous breathing, nonlabored ventilation, respiratory function stable and patient connected to nasal cannula oxygen Cardiovascular status: blood pressure returned to baseline and stable Postop Assessment: no apparent nausea or vomiting Anesthetic complications: no   No complications documented.   Last Vitals:  Vitals:   08/20/20 0916 08/20/20 0927  BP: 114/76 112/71  Pulse: 96 78  Resp: 19 16  Temp: 37.1 C (!) 36.3 C  SpO2: 95% 96%    Last Pain:  Vitals:   08/23/20 0844  TempSrc:   PainSc: 0-No pain                 Yevette Edwards

## 2020-08-23 NOTE — Progress Notes (Signed)
  OBSTETRICS POSTPARTUM CLINIC PROGRESS NOTE  Subjective:     Marissa Herman is a 29 y.o. G5P2002 female who presents for a postpartum visit. She is 2 weeks postpartum following a Term pregnancy and delivery by Vaginal problems after delivery including retained products of conception.  I have fully reviewed the prenatal and intrapartum course. Anesthesia: epidural.  Postpartum course has been complicated by retained products, removed by D&E last week, patient reports bleeding is greatly improved Baby is feeding by Breast.  Bleeding: patient has not  resumed menses.  Bowel function is normal. Bladder function is normal.  Patient is not sexually active. Contraception method desired is tubal ligation.  Postpartum depression screening: negative. Edinburgh 0.  The following portions of the patient's history were reviewed and updated as appropriate: allergies, current medications, past family history, past medical history, past social history, past surgical history and problem list.  Review of Systems Pertinent items are noted in HPI.  Objective:    BP 110/70   Ht 5\' 6"  (1.676 m)   Wt 196 lb 9.6 oz (89.2 kg)   Breastfeeding Yes   BMI 31.73 kg/m   General:  alert and no distress   Breasts:  inspection negative, no nipple discharge or bleeding, no masses or nodularity palpable  Lungs: clear to auscultation bilaterally  Heart:  regular rate and rhythm, S1, S2 normal, no murmur, click, rub or gallop  Abdomen: soft, non-tender; bowel sounds normal; no masses,  no organomegaly.                              Assessment:  Post Partum Care visit 1. Postpartum care and examination  -patient doing well after D&E  2. Consultation for female sterilization Sent note to scheduler. Discussed pros and cons or partial versus complete salpingectomy. Patient desires partial salpingectomy   Plan:  See orders and Patient Instructions Follow up in: 4 weeks or as needed.   MD, Adelene Idler OB[facility], [facility] 08/23/2020 2:45 PM

## 2020-08-23 NOTE — Patient Instructions (Signed)

## 2020-08-24 LAB — SURGICAL PATHOLOGY

## 2020-08-25 ENCOUNTER — Telehealth: Payer: Self-pay | Admitting: Obstetrics and Gynecology

## 2020-08-25 NOTE — Telephone Encounter (Signed)
-----   Message from Natale Milch, MD sent at 08/23/2020  2:43 PM EST ----- Surgery Booking Request Patient Full Name:  Holden Draughon  MRN: [id number]  DOB: Sep 10, 1991  Surgeon: Natale Milch, MD  Requested Surgery Date and Time: before 2022, but 2-3 weeks from now Primary Diagnosis AND Code: lFemale sterilization Secondary Diagnosis and Code:  Surgical Procedure: laparoscopic partial salpingectomy RNFA Requested?: No L&D Notification: No Admission Status: same day surgery Length of Surgery: 75 min Special Case Needs: No H&P: No Phone Interview???:  Yes Interpreter: No Medical Clearance:  No Special Scheduling Instructions: No Any known health/anesthesia issues, diabetes, sleep apnea, latex allergy, defibrillator/pacemaker?: No Acuity: P3   (P1 highest, P2 delay may cause harm, P3 low, elective gyn, P4 lowest)

## 2020-08-25 NOTE — Telephone Encounter (Signed)
Called patient to schedule laparoscopic partial salpingectomy w [facility]  DOS 12/30  H&P N/A  Covid testing 12/28 @ 8-10:30, [address], drive up and wear mask. Advised pt to quarantine until DOS.  Pre-admit phone call appointment to be requested - date and time will be included on H&P paper work. Also all appointments will be updated on pt MyChart. Explained that this appointment has a call window. Based on the time scheduled will indicate if the call will be received within a 4 hour window before 1:00 or after.  Advised that pt may also receive calls from the hospital pharmacy and pre-service center.  Confirmed pt has [employer] as Editor, commissioning. No secondary insurance.

## 2020-08-25 NOTE — Telephone Encounter (Signed)
Left a message for the patient to return the call.  

## 2020-08-29 ENCOUNTER — Other Ambulatory Visit: Payer: Self-pay | Admitting: Obstetrics and Gynecology

## 2020-09-15 ENCOUNTER — Encounter
Admission: RE | Admit: 2020-09-15 | Discharge: 2020-09-15 | Disposition: A | Discharge status: Home / Self Care | Payer: BC Managed Care – PPO | Source: Ambulatory Visit | Attending: Obstetrics and Gynecology | Admitting: Obstetrics and Gynecology

## 2020-09-15 DIAGNOSIS — Z01818 Encounter for other preprocedural examination: Secondary | ICD-10-CM | POA: Insufficient documentation

## 2020-09-15 NOTE — Patient Instructions (Signed)
Your procedure is scheduled on:09-23-20 THURSDAY Report to the Registration Desk on the 1st floor of the Medical Mall-Then proceed to the 2nd floor Surgery Desk in the Medical Mall To find out your arrival time, please call [phone] between 1PM - 3PM on:09-22-20 WEDNESDAY  REMEMBER: Instructions that are not followed completely may result in serious medical risk, up to and including death; or upon the discretion of your surgeon and anesthesiologist your surgery may need to be rescheduled.  Do not eat food after midnight the night before surgery.  No gum chewing, lozengers or hard candies.  You may however, drink CLEAR liquids up to 2 hours before you are scheduled to arrive for your surgery. Do not drink anything within 2 hours of your scheduled arrival time.  Clear liquids include: - water  - apple juice without pulp - gatorade (not RED, PURPLE, OR BLUE) - black coffee or tea (Do NOT add milk or creamers to the coffee or tea) Do NOT drink anything that is not on this list.  TAKE THESE MEDICATIONS THE MORNING OF SURGERY WITH A SIP OF WATER: -PRILOSEC (OMEPRAZOLE)-take one the night before and one on the morning of surgery - helps to prevent nausea after surgery.)  One week prior to surgery: Stop Anti-inflammatories (NSAIDS) such as Advil, Aleve, Ibuprofen, Motrin, Naproxen, Naprosyn and Aspirin based products such as Excedrin, Goodys Powder, BC Powder-OK TO TAKE TYLENOL/TRAMADOL IF NEEDED  Stop ANY OVER THE COUNTER supplements until after surgery. (However, you may continue taking Prenatal Gummy Vitamin up until the day before surgery.)  No Alcohol for 24 hours before or after surgery.  No Smoking including e-cigarettes for 24 hours prior to surgery.  No chewable tobacco products for at least 6 hours prior to surgery.  No nicotine patches on the day of surgery.  Do not use any "recreational" drugs for at least a week prior to your surgery.  Please be advised that the  combination of cocaine and anesthesia may have negative outcomes, up to and including death. If you test positive for cocaine, your surgery will be cancelled.  On the morning of surgery brush your teeth with toothpaste and water, you may rinse your mouth with mouthwash if you wish. Do not swallow any toothpaste or mouthwash.  Do not wear jewelry, make-up, hairpins, clips or nail polish.  Do not wear lotions, powders, or perfumes.   Do not shave body from the neck down 48 hours prior to surgery just in case you cut yourself which could leave a site for infection.  Also, freshly shaved skin may become irritated if using the CHG soap.  Contact lenses, hearing aids and dentures may not be worn into surgery.  Do not bring valuables to the hospital. [facility] is not responsible for any missing/lost belongings or valuables.   Use CHG Soap as directed on instruction sheet.  Notify your doctor if there is any change in your medical condition (cold, fever, infection).  Wear comfortable clothing (specific to your surgery type) to the hospital.  Plan for stool softeners for home use; pain medications have a tendency to cause constipation. You can also help prevent constipation by eating foods high in fiber such as fruits and vegetables and drinking plenty of fluids as your diet allows.  After surgery, you can help prevent lung complications by doing breathing exercises.  Take deep breaths and cough every 1-2 hours. Your doctor may order a device called an Incentive Spirometer to help you take deep breaths. When coughing  or sneezing, hold a pillow firmly against your incision with both hands. This is called "splinting." Doing this helps protect your incision. It also decreases belly discomfort.  If you are being admitted to the hospital overnight, leave your suitcase in the car. After surgery it may be brought to your room.  If you are being discharged the day of surgery, you will not be  allowed to drive home. You will need a responsible adult (18 years or older) to drive you home and stay with you that night.   If you are taking public transportation, you will need to have a responsible adult (18 years or older) with you. Please confirm with your physician that it is acceptable to use public transportation.   Please call the Pre-admissions Testing Dept. at [phone] if you have any questions about these instructions.  Visitation Policy:  Patients undergoing a surgery or procedure may have one family member or support person with them as long as that person is not COVID-19 positive or experiencing its symptoms.  That person may remain in the waiting area during the procedure.  Inpatient Visitation Update:   In an effort to ensure the safety of our team members and our patients, we are implementing a change to our visitation policy:  Effective Monday, Aug. 9, at 7 a.m., inpatients will be allowed one support person.  o The support person may change daily.  o The support person must pass our screening, gel in and out, and wear a mask at all times, including in the patient's room.  o Patients must also wear a mask when staff or their support person are in the room.  o Masking is required regardless of vaccination status.  Systemwide, no visitors 17 or younger.  BRING YOUR BREAST PUMP WITH YOU TO THE HOSPITAL

## 2020-09-20 ENCOUNTER — Other Ambulatory Visit: Payer: Self-pay

## 2020-09-20 ENCOUNTER — Encounter: Payer: Self-pay | Admitting: Obstetrics and Gynecology

## 2020-09-20 ENCOUNTER — Ambulatory Visit (INDEPENDENT_AMBULATORY_CARE_PROVIDER_SITE_OTHER): Payer: BC Managed Care – PPO | Admitting: Obstetrics and Gynecology

## 2020-09-20 DIAGNOSIS — Z01818 Encounter for other preprocedural examination: Secondary | ICD-10-CM | POA: Diagnosis not present

## 2020-09-20 DIAGNOSIS — Z302 Encounter for sterilization: Secondary | ICD-10-CM | POA: Diagnosis not present

## 2020-09-20 DIAGNOSIS — Z3009 Encounter for other general counseling and advice on contraception: Secondary | ICD-10-CM

## 2020-09-20 NOTE — Patient Instructions (Signed)

## 2020-09-20 NOTE — H&P (View-Only) (Signed)
 Patient ID: Marissa Herman, female   DOB: 05/02/1991, 29 y.o.   MRN: [id number]  Reason for Consult: No chief complaint on file.   Referred by [facility]  Subjective:     HPI:  Marissa Herman is a 29 y.o. female. She is here today for her postpartum visit and preop visit. She desires laparoscopic partial salpingectomy which is planned for later this week.   She reports that she has been feeling well. Her bleeding has reduced. She does continue to have small amounts of daily spotting, but it is generally light and a liner is adequate.   She is breastfeeding without issue.  She has not yet been sexually active.  Pain is controlled. Depression screening WNL.    Past Medical History:  Diagnosis Date  . History of abnormal cervical Pap smear 09/10/2016   LGSIL/ biopsy +HPV changes  . Medical history non-contributory    Family History  Problem Relation Age of Onset  . Diabetes Father   . Heart disease Maternal Grandmother   . Hypertension Maternal Grandmother   . Stroke Maternal Grandmother   . Heart disease Paternal Grandfather        Had heart attack   Past Surgical History:  Procedure Laterality Date  . COLPOSCOPY  11/10/2016   [facility] LSIL  . DILATION AND CURETTAGE OF UTERUS N/A 08/20/2020   Procedure: DILATATION AND CURETTAGE;  Surgeon: Faron Whitelock R, MD;  Location: [facility];  Service: Gynecology;  Laterality: N/A;  . PERINEAL LACERATION REPAIR N/A 08/05/2020   Procedure: SUTURE REPAIR PERINEAL LACERATION;  Surgeon: Shelvia Fojtik R, MD;  Location: [facility];  Service: Gynecology;  Laterality: N/A;  . WISDOM TOOTH EXTRACTION      Short Social History:  Social History   Tobacco Use  . Smoking status: Former Smoker    Years: 10.00    Types: Cigarettes    Quit date: 09/15/2017    Years since quitting: 3.0  . Smokeless tobacco: Never Used  . Tobacco comment: 1 pack/week of cigarettes. Quit March 2018  Substance Use Topics   . Alcohol use: No    Comment: former    Allergies  Allergen Reactions  . Other Rash    Nitrile gloves prolong exposure    Current Outpatient Medications  Medication Sig Dispense Refill  . omeprazole (PRILOSEC) 20 MG capsule Take 1 capsule (20 mg total) by mouth daily. (Patient taking differently: Take 20 mg by mouth as needed.) 30 capsule 11  . Prenatal MV-Min-FA-Omega-3 (PRENATAL GUMMIES/DHA & FA PO) Take 1 tablet by mouth daily at 12 noon.    . ferrous sulfate 325 (65 FE) MG tablet TAKE 1 TABLET BY MOUTH 2 TIMES DAILY WITH A MEAL. (Patient not taking: No sig reported) 60 tablet 0  . ibuprofen (ADVIL) 600 MG tablet Take 1 tablet (600 mg total) by mouth every 6 (six) hours. (Patient not taking: Reported on 09/20/2020) 30 tablet 0  . traMADol (ULTRAM) 50 MG tablet Take 1 tablet (50 mg total) by mouth every 6 (six) hours as needed. (Patient not taking: No sig reported) 10 tablet 0   No current facility-administered medications for this visit.    Review of Systems  Constitutional: Negative for chills, fatigue, fever and unexpected weight change.  HENT: Negative for trouble swallowing.  Eyes: Negative for loss of vision.  Respiratory: Negative for cough, shortness of breath and wheezing.  Cardiovascular: Negative for chest pain, leg swelling, palpitations and syncope.  GI: Negative for abdominal pain, blood in   stool, diarrhea, nausea and vomiting.  GU: Negative for difficulty urinating, dysuria, frequency and hematuria.  Musculoskeletal: Negative for back pain, leg pain and joint pain.  Skin: Negative for rash.  Neurological: Negative for dizziness, headaches, light-headedness, numbness and seizures.  Psychiatric: Negative for behavioral problem, confusion, depressed mood and sleep disturbance.        Objective:  Objective   Vitals:   09/20/20 0954  BP: 118/70  Pulse: 88  Weight: 196 lb (88.9 kg)  Height: 5\' 6"  (1.676 m)   Body mass index is 31.64 kg/m.  Physical  Exam Vitals and nursing note reviewed.  Constitutional:      Appearance: She is well-developed and well-nourished.  HENT:     Head: Normocephalic and atraumatic.  Eyes:     Extraocular Movements: EOM normal.     Pupils: Pupils are equal, round, and reactive to light.  Cardiovascular:     Rate and Rhythm: Normal rate and regular rhythm.  Pulmonary:     Effort: Pulmonary effort is normal. No respiratory distress.  Skin:    General: Skin is warm and dry.  Neurological:     Mental Status: She is alert and oriented to person, place, and time.  Psychiatric:        Mood and Affect: Mood and affect normal.        Behavior: Behavior normal.        Thought Content: Thought content normal.        Judgment: Judgment normal.     Assessment/Plan:     29 yo who desires sterilization by laparoscopic partial salpingectomy.   The patient has been fully informed about all methods of contraception, both temporary and permanent. She understands that tubal ligation is meant to be permanent, absolute and irreversible. She was told that there is an approximately 1 in 400 chance of a pregnancy in the future after tubal ligation. She was told the short and long term complications of tubal ligation. She understands the risks from this surgery include, but are not limited to, the risks of anesthesia, hemorrhage, infection, perforation, and injury to adjacent structures, bowel, bladder and blood vessels.   More than 25 minutes were spent face to face with the patient in the room, reviewing the medical record, labs and images, and coordinating care for the patient. The plan of management was discussed in detail and counseling was provided.    38 MD [facility], [facility] 09/20/2020 10:14 AM

## 2020-09-20 NOTE — Progress Notes (Signed)
Patient ID: Marissa Herman, female   DOB: 15-Jan-1991, 29 y.o.   MRN: [id number]  Reason for Consult: No chief complaint on file.   Referred by Nira Retort  Subjective:     HPI:  Marissa Herman is a 29 y.o. female. She is here today for her postpartum visit and preop visit. She desires laparoscopic partial salpingectomy which is planned for later this week.   She reports that she has been feeling well. Her bleeding has reduced. She does continue to have small amounts of daily spotting, but it is generally light and a liner is adequate.   She is breastfeeding without issue.  She has not yet been sexually active.  Pain is controlled. Depression screening WNL.    Past Medical History:  Diagnosis Date  . History of abnormal cervical Pap smear 09/10/2016   LGSIL/ biopsy +HPV changes  . Medical history non-contributory    Family History  Problem Relation Age of Onset  . Diabetes Father   . Heart disease Maternal Grandmother   . Hypertension Maternal Grandmother   . Stroke Maternal Grandmother   . Heart disease Paternal Grandfather        Had heart attack   Past Surgical History:  Procedure Laterality Date  . COLPOSCOPY  11/10/2016   [facility] LSIL  . DILATION AND CURETTAGE OF UTERUS N/A 08/20/2020   Procedure: DILATATION AND CURETTAGE;  Surgeon: Natale Milch, MD;  Location: [facility];  Service: Gynecology;  Laterality: N/A;  . PERINEAL LACERATION REPAIR N/A 08/05/2020   Procedure: SUTURE REPAIR PERINEAL LACERATION;  Surgeon: Natale Milch, MD;  Location: [facility];  Service: Gynecology;  Laterality: N/A;  . WISDOM TOOTH EXTRACTION      Short Social History:  Social History   Tobacco Use  . Smoking status: Former Smoker    Years: 10.00    Types: Cigarettes    Quit date: 09/15/2017    Years since quitting: 3.0  . Smokeless tobacco: Never Used  . Tobacco comment: 1 pack/week of cigarettes. Quit March 2018  Substance Use Topics   . Alcohol use: No    Comment: former    Allergies  Allergen Reactions  . Other Rash    Nitrile gloves prolong exposure    Current Outpatient Medications  Medication Sig Dispense Refill  . omeprazole (PRILOSEC) 20 MG capsule Take 1 capsule (20 mg total) by mouth daily. (Patient taking differently: Take 20 mg by mouth as needed.) 30 capsule 11  . Prenatal MV-Min-FA-Omega-3 (PRENATAL GUMMIES/DHA & FA PO) Take 1 tablet by mouth daily at 12 noon.    . ferrous sulfate 325 (65 FE) MG tablet TAKE 1 TABLET BY MOUTH 2 TIMES DAILY WITH A MEAL. (Patient not taking: No sig reported) 60 tablet 0  . ibuprofen (ADVIL) 600 MG tablet Take 1 tablet (600 mg total) by mouth every 6 (six) hours. (Patient not taking: Reported on 09/20/2020) 30 tablet 0  . traMADol (ULTRAM) 50 MG tablet Take 1 tablet (50 mg total) by mouth every 6 (six) hours as needed. (Patient not taking: No sig reported) 10 tablet 0   No current facility-administered medications for this visit.    Review of Systems  Constitutional: Negative for chills, fatigue, fever and unexpected weight change.  HENT: Negative for trouble swallowing.  Eyes: Negative for loss of vision.  Respiratory: Negative for cough, shortness of breath and wheezing.  Cardiovascular: Negative for chest pain, leg swelling, palpitations and syncope.  GI: Negative for abdominal pain, blood in  stool, diarrhea, nausea and vomiting.  GU: Negative for difficulty urinating, dysuria, frequency and hematuria.  Musculoskeletal: Negative for back pain, leg pain and joint pain.  Skin: Negative for rash.  Neurological: Negative for dizziness, headaches, light-headedness, numbness and seizures.  Psychiatric: Negative for behavioral problem, confusion, depressed mood and sleep disturbance.        Objective:  Objective   Vitals:   09/20/20 0954  BP: 118/70  Pulse: 88  Weight: 196 lb (88.9 kg)  Height: 5\' 6"  (1.676 m)   Body mass index is 31.64 kg/m.  Physical  Exam Vitals and nursing note reviewed.  Constitutional:      Appearance: She is well-developed and well-nourished.  HENT:     Head: Normocephalic and atraumatic.  Eyes:     Extraocular Movements: EOM normal.     Pupils: Pupils are equal, round, and reactive to light.  Cardiovascular:     Rate and Rhythm: Normal rate and regular rhythm.  Pulmonary:     Effort: Pulmonary effort is normal. No respiratory distress.  Skin:    General: Skin is warm and dry.  Neurological:     Mental Status: She is alert and oriented to person, place, and time.  Psychiatric:        Mood and Affect: Mood and affect normal.        Behavior: Behavior normal.        Thought Content: Thought content normal.        Judgment: Judgment normal.     Assessment/Plan:     29 yo who desires sterilization by laparoscopic partial salpingectomy.   The patient has been fully informed about all methods of contraception, both temporary and permanent. She understands that tubal ligation is meant to be permanent, absolute and irreversible. She was told that there is an approximately 1 in 400 chance of a pregnancy in the future after tubal ligation. She was told the short and long term complications of tubal ligation. She understands the risks from this surgery include, but are not limited to, the risks of anesthesia, hemorrhage, infection, perforation, and injury to adjacent structures, bowel, bladder and blood vessels.   More than 25 minutes were spent face to face with the patient in the room, reviewing the medical record, labs and images, and coordinating care for the patient. The plan of management was discussed in detail and counseling was provided.    38 MD [facility], [facility] 09/20/2020 10:14 AM

## 2020-09-21 ENCOUNTER — Other Ambulatory Visit: Payer: BC Managed Care – PPO

## 2020-09-21 ENCOUNTER — Encounter
Admission: RE | Admit: 2020-09-21 | Discharge: 2020-09-21 | Disposition: A | Discharge status: Home / Self Care | Payer: BC Managed Care – PPO | Source: Ambulatory Visit | Attending: Obstetrics and Gynecology | Admitting: Obstetrics and Gynecology

## 2020-09-21 DIAGNOSIS — Z20822 Contact with and (suspected) exposure to covid-19: Secondary | ICD-10-CM | POA: Diagnosis not present

## 2020-09-21 DIAGNOSIS — Z79899 Other long term (current) drug therapy: Secondary | ICD-10-CM | POA: Diagnosis not present

## 2020-09-21 DIAGNOSIS — Z01812 Encounter for preprocedural laboratory examination: Secondary | ICD-10-CM | POA: Insufficient documentation

## 2020-09-21 DIAGNOSIS — Z87891 Personal history of nicotine dependence: Secondary | ICD-10-CM | POA: Diagnosis not present

## 2020-09-21 DIAGNOSIS — Z302 Encounter for sterilization: Secondary | ICD-10-CM | POA: Diagnosis not present

## 2020-09-21 LAB — CBC
HCT: 34.5 % — ABNORMAL LOW (ref 36.0–46.0)
Hemoglobin: 11.3 g/dL — ABNORMAL LOW (ref 12.0–15.0)
MCH: 27.6 pg (ref 26.0–34.0)
MCHC: 32.8 g/dL (ref 30.0–36.0)
MCV: 84.1 fL (ref 80.0–100.0)
Platelets: 265 10*3/uL (ref 150–400)
RBC: 4.1 MIL/uL (ref 3.87–5.11)
RDW: 12 % (ref 11.5–15.5)
WBC: 8 10*3/uL (ref 4.0–10.5)
nRBC: 0 % (ref 0.0–0.2)

## 2020-09-21 LAB — TYPE AND SCREEN
ABO/RH(D): O POS
Antibody Screen: NEGATIVE
Extend sample reason: UNDETERMINED

## 2020-09-22 LAB — SARS CORONAVIRUS 2 (TAT 6-24 HRS): SARS Coronavirus 2: NEGATIVE

## 2020-09-23 ENCOUNTER — Encounter
Admission: RE | Disposition: A | Discharge status: Home / Self Care | Payer: Self-pay | Source: Home / Self Care | Attending: Obstetrics and Gynecology

## 2020-09-23 ENCOUNTER — Ambulatory Visit: Payer: BC Managed Care – PPO | Admitting: Anesthesiology

## 2020-09-23 ENCOUNTER — Other Ambulatory Visit: Payer: Self-pay

## 2020-09-23 ENCOUNTER — Encounter: Payer: Self-pay | Admitting: Obstetrics and Gynecology

## 2020-09-23 ENCOUNTER — Ambulatory Visit
Admission: RE | Admit: 2020-09-23 | Discharge: 2020-09-23 | Disposition: A | Discharge status: Home / Self Care | Payer: BC Managed Care – PPO | Attending: Obstetrics and Gynecology | Admitting: Obstetrics and Gynecology

## 2020-09-23 DIAGNOSIS — Z302 Encounter for sterilization: Secondary | ICD-10-CM | POA: Diagnosis not present

## 2020-09-23 DIAGNOSIS — Z20822 Contact with and (suspected) exposure to covid-19: Secondary | ICD-10-CM | POA: Insufficient documentation

## 2020-09-23 DIAGNOSIS — Z79899 Other long term (current) drug therapy: Secondary | ICD-10-CM | POA: Insufficient documentation

## 2020-09-23 DIAGNOSIS — Z87891 Personal history of nicotine dependence: Secondary | ICD-10-CM | POA: Insufficient documentation

## 2020-09-23 HISTORY — PX: LAPAROSCOPIC TUBAL LIGATION: SHX1937

## 2020-09-23 LAB — POCT PREGNANCY, URINE: Preg Test, Ur: NEGATIVE

## 2020-09-23 SURGERY — LIGATION, FALLOPIAN TUBE, LAPAROSCOPIC
Anesthesia: General | Laterality: Bilateral

## 2020-09-23 MED ORDER — FENTANYL CITRATE (PF) 100 MCG/2ML IJ SOLN
INTRAMUSCULAR | Status: AC
  Administered 2020-09-23: 25 ug via INTRAVENOUS
  Filled 2020-09-23: qty 2

## 2020-09-23 MED ORDER — MIDAZOLAM HCL 2 MG/2ML IJ SOLN
INTRAMUSCULAR | Status: DC | PRN
  Administered 2020-09-23: 2 mg via INTRAVENOUS

## 2020-09-23 MED ORDER — FENTANYL CITRATE (PF) 100 MCG/2ML IJ SOLN
INTRAMUSCULAR | Status: DC | PRN
  Administered 2020-09-23 (×2): 50 ug via INTRAVENOUS

## 2020-09-23 MED ORDER — CHLORHEXIDINE GLUCONATE 0.12 % MT SOLN
OROMUCOSAL | Status: AC
  Administered 2020-09-23: 15 mL via OROMUCOSAL
  Filled 2020-09-23: qty 15

## 2020-09-23 MED ORDER — ONDANSETRON HCL 4 MG/2ML IJ SOLN
INTRAMUSCULAR | Status: DC | PRN
  Administered 2020-09-23 (×2): 4 mg via INTRAVENOUS

## 2020-09-23 MED ORDER — DEXMEDETOMIDINE (PRECEDEX) IN NS 20 MCG/5ML (4 MCG/ML) IV SYRINGE
PREFILLED_SYRINGE | INTRAVENOUS | Status: DC | PRN
  Administered 2020-09-23: 12 ug via INTRAVENOUS
  Administered 2020-09-23: 4 ug via INTRAVENOUS
  Administered 2020-09-23: 8 ug via INTRAVENOUS
  Administered 2020-09-23: 12 ug via INTRAVENOUS
  Administered 2020-09-23: 4 ug via INTRAVENOUS

## 2020-09-23 MED ORDER — POVIDONE-IODINE 10 % EX SWAB: 2.0000 "application " | Freq: Once | CUTANEOUS | Status: DC

## 2020-09-23 MED ORDER — ACETAMINOPHEN 10 MG/ML IV SOLN
INTRAVENOUS | Status: DC | PRN
  Administered 2020-09-23: 1000 mg via INTRAVENOUS

## 2020-09-23 MED ORDER — FAMOTIDINE 20 MG PO TABS: 20.0000 mg | ORAL_TABLET | Freq: Once | ORAL | Status: AC

## 2020-09-23 MED ORDER — LIDOCAINE HCL (PF) 2 % IJ SOLN
INTRAMUSCULAR | Status: AC
  Filled 2020-09-23: qty 10

## 2020-09-23 MED ORDER — SUGAMMADEX SODIUM 500 MG/5ML IV SOLN
INTRAVENOUS | Status: DC | PRN
  Administered 2020-09-23: 400 mg via INTRAVENOUS

## 2020-09-23 MED ORDER — IBUPROFEN 600 MG PO TABS: 600.0000 mg | ORAL_TABLET | Freq: Four times a day (QID) | ORAL | 0 refills | Status: DC | PRN

## 2020-09-23 MED ORDER — ROCURONIUM BROMIDE 100 MG/10ML IV SOLN
INTRAVENOUS | Status: DC | PRN
  Administered 2020-09-23: 50 mg via INTRAVENOUS

## 2020-09-23 MED ORDER — PROMETHAZINE HCL 25 MG/ML IJ SOLN: 6.2500 mg | INTRAMUSCULAR | Status: DC | PRN

## 2020-09-23 MED ORDER — ORAL CARE MOUTH RINSE: 15.0000 mL | Freq: Once | OROMUCOSAL | Status: AC

## 2020-09-23 MED ORDER — DEXAMETHASONE SODIUM PHOSPHATE 10 MG/ML IJ SOLN
INTRAMUSCULAR | Status: DC | PRN
  Administered 2020-09-23: 10 mg via INTRAVENOUS

## 2020-09-23 MED ORDER — MIDAZOLAM HCL 2 MG/2ML IJ SOLN
INTRAMUSCULAR | Status: AC
  Filled 2020-09-23: qty 2

## 2020-09-23 MED ORDER — KETOROLAC TROMETHAMINE 30 MG/ML IJ SOLN: INTRAMUSCULAR | Status: DC | PRN

## 2020-09-23 MED ORDER — FAMOTIDINE 20 MG PO TABS
ORAL_TABLET | ORAL | Status: AC
  Administered 2020-09-23: 20 mg via ORAL
  Filled 2020-09-23: qty 1

## 2020-09-23 MED ORDER — TRAMADOL HCL 50 MG PO TABS: 50.0000 mg | ORAL_TABLET | Freq: Four times a day (QID) | ORAL | 0 refills | Status: DC | PRN

## 2020-09-23 MED ORDER — KETOROLAC TROMETHAMINE 30 MG/ML IJ SOLN
INTRAMUSCULAR | Status: DC | PRN
  Administered 2020-09-23: 30 mg via INTRAVENOUS

## 2020-09-23 MED ORDER — GLYCOPYRROLATE 0.2 MG/ML IJ SOLN
INTRAMUSCULAR | Status: DC | PRN
  Administered 2020-09-23: .2 mg via INTRAVENOUS

## 2020-09-23 MED ORDER — FENTANYL CITRATE (PF) 100 MCG/2ML IJ SOLN
INTRAMUSCULAR | Status: AC
  Filled 2020-09-23: qty 2

## 2020-09-23 MED ORDER — SCOPOLAMINE 1 MG/3DAYS TD PT72
1.0000 | MEDICATED_PATCH | Freq: Once | TRANSDERMAL | Status: DC
  Administered 2020-09-23: 1.5 mg via TRANSDERMAL

## 2020-09-23 MED ORDER — LACTATED RINGERS IV SOLN: INTRAVENOUS | Status: DC

## 2020-09-23 MED ORDER — PROPOFOL 10 MG/ML IV BOLUS
INTRAVENOUS | Status: DC | PRN
  Administered 2020-09-23: 200 mg via INTRAVENOUS

## 2020-09-23 MED ORDER — CHLORHEXIDINE GLUCONATE 0.12 % MT SOLN: 15.0000 mL | Freq: Once | OROMUCOSAL | Status: AC

## 2020-09-23 MED ORDER — FENTANYL CITRATE (PF) 100 MCG/2ML IJ SOLN
25.0000 ug | INTRAMUSCULAR | Status: DC | PRN
  Administered 2020-09-23: 25 ug via INTRAVENOUS

## 2020-09-23 MED ORDER — BUPIVACAINE LIPOSOME 1.3 % IJ SUSP
INTRAMUSCULAR | Status: DC | PRN
  Administered 2020-09-23: 14 mL

## 2020-09-23 MED ORDER — SCOPOLAMINE 1 MG/3DAYS TD PT72
MEDICATED_PATCH | TRANSDERMAL | Status: AC
  Filled 2020-09-23: qty 1

## 2020-09-23 MED ORDER — ACETAMINOPHEN 500 MG PO TABS: 1000.0000 mg | ORAL_TABLET | Freq: Four times a day (QID) | ORAL | 0 refills | Status: DC | PRN

## 2020-09-23 MED ORDER — PROPOFOL 10 MG/ML IV BOLUS
INTRAVENOUS | Status: AC
  Filled 2020-09-23: qty 40

## 2020-09-23 MED ORDER — BUPIVACAINE LIPOSOME 1.3 % IJ SUSP
INTRAMUSCULAR | Status: AC
  Filled 2020-09-23: qty 20

## 2020-09-23 MED ORDER — LIDOCAINE HCL (CARDIAC) PF 100 MG/5ML IV SOSY
PREFILLED_SYRINGE | INTRAVENOUS | Status: DC | PRN
  Administered 2020-09-23: 100 mg via INTRAVENOUS

## 2020-09-23 SURGICAL SUPPLY — 31 items
BAG URINE DRAIN 2000ML AR STRL (UROLOGICAL SUPPLIES) ×2 IMPLANT
BLADE SURG SZ11 CARB STEEL (BLADE) ×2 IMPLANT
CATH FOL 2WAY LX 16X5 (CATHETERS) ×2 IMPLANT
CHLORAPREP W/TINT 26 (MISCELLANEOUS) ×2 IMPLANT
COVER WAND RF STERILE (DRAPES) ×2 IMPLANT
DERMABOND ADVANCED (GAUZE/BANDAGES/DRESSINGS) ×1
DERMABOND ADVANCED .7 DNX12 (GAUZE/BANDAGES/DRESSINGS) ×1 IMPLANT
DRSG TEGADERM 2-3/8X2-3/4 SM (GAUZE/BANDAGES/DRESSINGS) ×2 IMPLANT
ELECT REM PT RETURN 9FT ADLT (ELECTROSURGICAL) ×2
ELECTRODE REM PT RTRN 9FT ADLT (ELECTROSURGICAL) ×1 IMPLANT
GAUZE 4X4 16PLY RFD (DISPOSABLE) ×2 IMPLANT
GLOVE BIOGEL PI IND STRL 6.5 (GLOVE) ×4 IMPLANT
GLOVE BIOGEL PI INDICATOR 6.5 (GLOVE) ×4
GLOVE SURG SYN 6.5 ES PF (GLOVE) ×8 IMPLANT
GOWN STRL REUS W/ TWL LRG LVL3 (GOWN DISPOSABLE) ×2 IMPLANT
GOWN STRL REUS W/TWL LRG LVL3 (GOWN DISPOSABLE) ×2
KIT PINK PAD W/HEAD ARE REST (MISCELLANEOUS) ×2
KIT PINK PAD W/HEAD ARM REST (MISCELLANEOUS) ×1 IMPLANT
KIT TURNOVER CYSTO (KITS) ×2 IMPLANT
LABEL OR SOLS (LABEL) ×2 IMPLANT
MANIFOLD NEPTUNE II (INSTRUMENTS) ×2 IMPLANT
NS IRRIG 500ML POUR BTL (IV SOLUTION) ×2 IMPLANT
PACK GYN LAPAROSCOPIC (MISCELLANEOUS) ×2 IMPLANT
PAD OB MATERNITY 4.3X12.25 (PERSONAL CARE ITEMS) ×2 IMPLANT
PAD PREP 24X41 OB/GYN DISP (PERSONAL CARE ITEMS) ×2 IMPLANT
SET TUBE SMOKE EVAC HIGH FLOW (TUBING) ×2 IMPLANT
SHEARS HARMONIC ACE PLUS 36CM (ENDOMECHANICALS) ×2 IMPLANT
SLEEVE ENDOPATH XCEL 5M (ENDOMECHANICALS) ×4 IMPLANT
SUT MNCRL AB 4-0 PS2 18 (SUTURE) ×2 IMPLANT
SYR 10ML LL (SYRINGE) ×2 IMPLANT
TROCAR XCEL NON-BLD 5MMX100MML (ENDOMECHANICALS) ×2 IMPLANT

## 2020-09-23 NOTE — Anesthesia Preprocedure Evaluation (Addendum)
Anesthesia Evaluation  Patient identified by MRN, date of birth, ID band Patient awake    Reviewed: Allergy & Precautions, H&P , NPO status , Patient's Chart, lab work & pertinent test results, reviewed documented beta blocker date and time   History of Anesthesia Complications Negative for: history of anesthetic complications  Airway Mallampati: I  TM Distance: >3 FB Neck ROM: full    Dental  (+) Teeth Intact, Dental Advidsory Given   Pulmonary neg pulmonary ROS, former smoker,    Pulmonary exam normal        Cardiovascular Exercise Tolerance: Good negative cardio ROS Normal cardiovascular exam Rate:Normal     Neuro/Psych  Headaches, negative psych ROS   GI/Hepatic Neg liver ROS, GERD  Medicated,  Endo/Other  negative endocrine ROS  Renal/GU negative Renal ROS  negative genitourinary   Musculoskeletal   Abdominal   Peds  Hematology negative hematology ROS (+)   Anesthesia Other Findings Past Medical History: 09/10/2016: History of abnormal cervical Pap smear     Comment:  LGSIL/ biopsy +HPV changes No date: Medical history non-contributory   Reproductive/Obstetrics negative OB ROS                             Anesthesia Physical  Anesthesia Plan  ASA: II  Anesthesia Plan: General   Post-op Pain Management:    Induction: Intravenous  PONV Risk Score and Plan: 4 or greater and Ondansetron, Dexamethasone, Midazolam, Promethazine, Treatment may vary due to age or medical condition and Scopolamine patch - Pre-op  Airway Management Planned: Oral ETT  Additional Equipment:   Intra-op Plan:   Post-operative Plan: Extubation in OR  Informed Consent: I have reviewed the patients History and Physical, chart, labs and discussed the procedure including the risks, benefits and alternatives for the proposed anesthesia with the patient or authorized representative who has indicated  his/her understanding and acceptance.       Plan Discussed with: CRNA  Anesthesia Plan Comments:        Anesthesia Quick Evaluation

## 2020-09-23 NOTE — Op Note (Signed)
  Operative Note   09/23/2020  PRE-OP DIAGNOSIS: Desire for permanent sterilization  POST-OP DIAGNOSIS: same   PROCEDURE: Procedure(s): LAPAROSCOPIC TUBAL LIGATION. PARTIAL SALPINGECTOMY  SURGEON: Adelene Idler MD  ASSISTANT: Vena Austria MD  ANESTHESIA: Choice   ESTIMATED BLOOD LOSS: less than 10 cc  COMPLICATIONS: None  DISPOSITION: PACU - hemodynamically stable.  CONDITION: stable  FINDINGS: Laparoscopic survey of the abdomen revealed a grossly normal uterus, tubes, ovaries, liver edge, gallbladder edge and appendix, No intra-abdominal adhesions were noted.    PROCEDURE IN DETAIL: The patient was taken to the OR where anesthesia was administed. The patient was positioned in dorsal lithotomy in the [place] stirrups. The patient was then examined under anesthesia with the above noted findings. The patient was prepped and draped in the normal sterile fashion and bladder was drained using a foley catheter. Speculum exam normal, uterus sounded to 7 cm and sponge stick was placed in the vagina for manipulation purposes.  Attention was turned to the patient's abdomen where a 5 mm skin incision was made in the umbilical fold, after injection of local anesthesia.  The 5 mm port was then placed under direct visualization with the operative laparoscope  The above noted findings. Pneumoperitoneum was obtained. Patient positioned into Trendelenburg.  A 5 mm trocar was then placed in the left and right lower quadrants under direct visualization with the laparoscope.  Right and left fallopian tubes are identified and followed out to their fimbria.  A 2 cm portion of each tube is excised utilizing the Harmonic scapel.  No injuries or bleeding was noted.  All instruments and ports were then removed from the abdomen after gas was expelled and patient was leveled.   The skin was closed with skin adhesive. The foley catheter was removed from the bladder. The sponge stick was removed from the  uterus.  The patient tolerated the procedure well. All counts were correct x 2. The patient was transferred to the recovery room awake, alert and breathing independently.  Dr. Bonney Aid assisted with this case. This was a high level case requiring a Financial controller. No other assistant was readily available. He assisted with manipulation of tissue, port placement, and excision of left fallopian tube during the case.   Adelene Idler MD [facility], [facility] 09/23/2020 2:49 PM

## 2020-09-23 NOTE — Anesthesia Procedure Notes (Signed)
Procedure Name: Intubation Performed by: Fletcher-Harrison, Sayf Kerner, CRNA Pre-anesthesia Checklist: Patient identified, Emergency Drugs available, Suction available and Patient being monitored Patient Re-evaluated:Patient Re-evaluated prior to induction Oxygen Delivery Method: Circle system utilized Preoxygenation: Pre-oxygenation with 100% oxygen Induction Type: IV induction Ventilation: Mask ventilation without difficulty Tube type: Oral Tube size: 6.5 mm Number of attempts: 1 Airway Equipment and Method: Stylet and Oral airway Placement Confirmation: ETT inserted through vocal cords under direct vision,  positive ETCO2,  breath sounds checked- equal and bilateral and CO2 detector Secured at: 21 cm Tube secured with: Tape Dental Injury: Teeth and Oropharynx as per pre-operative assessment        

## 2020-09-23 NOTE — Interval H&P Note (Signed)
History and Physical Interval Note:  09/23/2020 1:20 PM  [place]  has presented today for surgery, with the diagnosis of Sterilization.  The various methods of treatment have been discussed with the patient and family. After consideration of risks, benefits and other options for treatment, the patient has consented to  Procedure(s): LAPAROSCOPIC TUBAL LIGATION (Bilateral) as a surgical intervention.  The patient's history has been reviewed, patient examined, no change in status, stable for surgery.  I have reviewed the patient's chart and labs.  Questions were answered to the patient's satisfaction.     Danniela Mcbrearty R Desaray Marschner

## 2020-09-23 NOTE — Transfer of Care (Addendum)
Immediate Anesthesia Transfer of Care Note  Patient: Marissa Herman  Procedure(s) Performed: LAPAROSCOPIC TUBAL LIGATION (Bilateral )  Patient Location: PACU  Anesthesia Type:General  Level of Consciousness: awake, drowsy and patient cooperative  Airway & Oxygen Therapy: Patient Spontanous Breathing and Patient connected to face mask oxygen  Post-op Assessment: Report given to RN and Post -op Vital signs reviewed and stable  Post vital signs: Reviewed and stable  Last Vitals:  Vitals Value Taken Time  BP 86/45 09/23/20 1507  Temp    Pulse 60 09/23/20 1508  Resp    SpO2 100 % 09/23/20 1508  Vitals shown include unvalidated device data.  Last Pain:  Vitals:   09/23/20 1039  TempSrc: Temporal  PainSc: 0-No pain      Patients Stated Pain Goal: 0 (09/23/20 1039)  Complications: No complications documented.

## 2020-09-23 NOTE — Discharge Instructions (Signed)
AMBULATORY SURGERY  DISCHARGE INSTRUCTIONS   1) The drugs that you were given will stay in your system until tomorrow so for the next 24 hours you should not:  A) Drive an automobile B) Make any legal decisions C) Drink any alcoholic beverage   2) You may resume regular meals tomorrow.  Today it is better to start with liquids and gradually work up to solid foods.  You may eat anything you prefer, but it is better to start with liquids, then soup and crackers, and gradually work up to solid foods.   3) Please notify your doctor immediately if you have any unusual bleeding, trouble breathing, redness and pain at the surgery site, drainage, fever, or pain not relieved by medication. 4)    5) Additional Instructions: Hospital phone number  [phone]    Laparoscopic Tubal Ligation, Care After This sheet gives you information about how to care for yourself after your procedure. Your health care provider may also give you more specific instructions. If you have problems or questions, contact your health care provider. What can I expect after the procedure? After the procedure, it is common to have:  A sore throat.  Discomfort in your shoulder.  Mild discomfort or cramping in your abdomen.  Gas pains.  Pain or soreness in the area where the surgical incision was made.  A bloated feeling.  Tiredness.  Nausea.  Vomiting. Follow these instructions at home: Medicines  Take over-the-counter and prescription medicines only as told by your health care provider.  Do not take aspirin because it can cause bleeding.  Ask your health care provider if the medicine prescribed to you: ? Requires you to avoid driving or using heavy machinery. ? Can cause constipation. You may need to take actions to prevent or treat constipation, such as:  Drink enough fluid to keep your urine pale yellow.  Take over-the-counter or prescription medicines.  Eat foods that are high in fiber,  such as beans, whole grains, and fresh fruits and vegetables.  Limit foods that are high in fat and processed sugars, such as fried or sweet foods. Incision care      Follow instructions from your health care provider about how to take care of your incision. Make sure you: ? Wash your hands with soap and water before and after you change your bandage (dressing). If soap and water are not available, use hand sanitizer. ? Change your dressing as told by your health care provider. ? Leave stitches (sutures), skin glue, or adhesive strips in place. These skin closures may need to stay in place for 2 weeks or longer. If adhesive strip edges start to loosen and curl up, you may trim the loose edges. Do not remove adhesive strips completely unless your health care provider tells you to do that.  Check your incision area every day for signs of infection. Check for: ? Redness, swelling, or pain. ? Fluid or blood. ? Warmth. ? Pus or a bad smell. Activity  Rest as told by your health care provider.  Avoid sitting for a long time without moving. Get up to take short walks every 1-2 hours. This is important to improve blood flow and breathing. Ask for help if you feel weak or unsteady.  Return to your normal activities as told by your health care provider. Ask your health care provider what activities are safe for you. General instructions  Do not take baths, swim, or use a hot tub until your health care provider approves.  Ask your health care provider if you may take showers. You may only be allowed to take sponge baths.  Have someone help you with your daily household tasks for the first few days.  Keep all follow-up visits as told by your health care provider. This is important. Contact a health care provider if:  You have redness, swelling, or pain around your incision.  Your incision feels warm to the touch.  You have pus or a bad smell coming from your incision.  The edges of your  incision break open after the sutures have been removed.  Your pain does not improve after 2-3 days.  You have a rash.  You repeatedly become dizzy or light-headed.  Your pain medicine is not helping. Get help right away if you:  Have a fever.  Faint.  Have increasing pain in your abdomen.  Have severe pain in one or both of your shoulders.  Have fluid or blood coming from your sutures or from your vagina.  Have shortness of breath or difficulty breathing.  Have chest pain or leg pain.  Have ongoing nausea, vomiting, or diarrhea. Summary  After the procedure, it is common to have mild discomfort or cramping in your abdomen.  Take over-the-counter and prescription medicines only as told by your health care provider.  Watch for symptoms that should prompt you to call your health care provider.  Keep all follow-up visits as told by your health care provider. This is important. This information is not intended to replace advice given to you by your health care provider. Make sure you discuss any questions you have with your health care provider. Document Revised: 02/18/2019 Document Reviewed: 08/06/2018 Elsevier Patient Education  2020 [employer].

## 2020-09-24 ENCOUNTER — Encounter: Payer: Self-pay | Admitting: Obstetrics and Gynecology

## 2020-09-28 LAB — SURGICAL PATHOLOGY

## 2020-09-28 NOTE — Anesthesia Postprocedure Evaluation (Signed)
Anesthesia Post Note  Patient: [place]  Procedure(s) Performed: LAPAROSCOPIC TUBAL LIGATION (Bilateral )  Patient location during evaluation: PACU Anesthesia Type: General Level of consciousness: awake and alert Pain management: pain level controlled Vital Signs Assessment: post-procedure vital signs reviewed and stable Respiratory status: spontaneous breathing, nonlabored ventilation, respiratory function stable and patient connected to nasal cannula oxygen Cardiovascular status: blood pressure returned to baseline and stable Postop Assessment: no apparent nausea or vomiting Anesthetic complications: no   No complications documented.   Last Vitals:  Vitals:   09/23/20 1628 09/23/20 1640  BP: 107/68 (!) 121/51  Pulse: 62 64  Resp: 13 16  Temp:    SpO2: 95% 98%    Last Pain:  Vitals:   09/23/20 1640  TempSrc:   PainSc: 0-No pain                 Lenard Simmer

## 2020-10-04 ENCOUNTER — Other Ambulatory Visit: Payer: Self-pay

## 2020-10-04 ENCOUNTER — Encounter: Payer: Self-pay | Admitting: Obstetrics and Gynecology

## 2020-10-04 ENCOUNTER — Ambulatory Visit (INDEPENDENT_AMBULATORY_CARE_PROVIDER_SITE_OTHER): Payer: BC Managed Care – PPO | Admitting: Obstetrics and Gynecology

## 2020-10-04 VITALS — Ht 66.0 in | Wt 195.0 lb

## 2020-10-04 DIAGNOSIS — T8149XA Infection following a procedure, other surgical site, initial encounter: Secondary | ICD-10-CM

## 2020-10-04 DIAGNOSIS — Z9889 Other specified postprocedural states: Secondary | ICD-10-CM

## 2020-10-04 MED ORDER — SULFAMETHOXAZOLE-TRIMETHOPRIM 800-160 MG PO TABS: 1.0000 | ORAL_TABLET | Freq: Two times a day (BID) | ORAL | 0 refills | Status: AC

## 2020-10-04 NOTE — Progress Notes (Signed)
Phone visit Post Op  Pt states she has puss coming out on the right side thur the glue.

## 2020-10-04 NOTE — Patient Instructions (Signed)
Wound Infection A wound infection happens when tiny organisms (microorganisms) start to grow in a wound. A wound infection is most often caused by bacteria. Infection can cause the wound to break open or worsen. Wound infection needs treatment. If a wound infection is left untreated, complications can occur. Untreated wound infections may lead to an infection in the bloodstream (septicemia) or a bone infection (osteomyelitis). What are the causes? This condition is most often caused by bacteria growing in a wound. Other microorganisms, like yeast and fungi, can also cause wound infections. What increases the risk? The following factors may make you more likely to develop this condition:  Having a weak body defense system (immune system).  Having diabetes.  Taking steroid medicines for a long time (chronic use).  Smoking.  Being an older person.  Being overweight.  Taking chemotherapy medicines. What are the signs or symptoms? Symptoms of this condition include:  Having more redness, swelling, or pain at the wound site.  Having more blood or fluid at the wound site.  A bad smell coming from a wound or bandage (dressing).  Having a fever.  Feeling tired or fatigued.  Having warmth at or around the wound.  Having pus at the wound site. How is this diagnosed? This condition is diagnosed with a medical history and physical exam. You may also have a wound culture or blood tests or both. How is this treated? This condition is usually treated with an antibiotic medicine.  The infection should improve 24-48 hours after you start antibiotics.  After 24-48 hours, redness around the wound should stop spreading, and the wound should be less painful. Follow these instructions at home: Medicines  Take or apply over-the-counter and prescription medicines only as told by your health care provider.  If you were prescribed an antibiotic medicine, take or apply it as told by your health  care provider. Do not stop using the antibiotic even if you start to feel better. Wound care  Clean the wound each day, or as told by your health care provider. ? Wash the wound with mild soap and water. ? Rinse the wound with water to remove all soap. ? Pat the wound dry with a clean towel. Do not rub it.  Follow instructions from your health care provider about how to take care of your wound. Make sure you: ? Wash your hands with soap and water before and after you change your dressing. If soap and water are not available, use hand sanitizer. ? Change your dressing as told by your health care provider. ? Leave stitches (sutures), skin glue, or adhesive strips in place if your wound has been closed. These skin closures may need to stay in place for 2 weeks or longer. If adhesive strip edges start to loosen and curl up, you may trim the loose edges. Do not remove adhesive strips completely unless your health care provider tells you to do that. Some wounds are left open to heal on their own.  Check your wound every day for signs of infection. Watch for: ? More redness, swelling, or pain. ? More fluid or blood. ? Warmth. ? Pus or a bad smell.   General instructions  Keep the dressing dry until your health care provider says it can be removed.  Do not take baths, swim, or use a hot tub until your health care provider approves. Ask your health care provider if you may take showers. You may only be allowed to take sponge baths.  Raise (  elevate) the injured area above the level of your heart while you are sitting or lying down.  Do not scratch or pick at the wound.  Keep all follow-up visits as told by your health care provider. This is important. Contact a health care provider if:  Your pain is not controlled with medicine.  You have more redness, swelling, or pain around your wound.  You have more fluid or blood coming from your wound.  Your wound feels warm to the touch.  You have  pus coming from your wound.  You continue to notice a bad smell coming from your wound or your dressing.  Your wound that was closed breaks open. Get help right away if:  You have a red streak going away from your wound.  You have a fever. Summary  A wound infection happens when tiny organisms (microorganisms) start to grow in a wound.  This condition is usually treated with an antibiotic medicine.  Follow instructions from your health care provider about how to take care of your wound.  Contact a health care provider if your wound infection does not begin to improve in 24-48 hours, or your symptoms worsen.  Keep all follow-up visits as told by your health care provider. This is important. This information is not intended to replace advice given to you by your health care provider. Make sure you discuss any questions you have with your health care provider. Document Revised: 04/23/2018 Document Reviewed: 04/23/2018 Elsevier Patient Education  2021 Elsevier Inc.  

## 2020-10-04 NOTE — Progress Notes (Signed)
     Virtual Visit via Telephone Note  I connected with Marissa Herman on 10/04/20 at  8:30 AM EST by telephone and verified that I am speaking with the correct person using two identifiers.   I discussed the limitations, risks, security and privacy concerns of performing an evaluation and management service by telephone and the availability of in person appointments. I also discussed with the patient that there may be a patient responsible charge related to this service. The patient expressed understanding and agreed to proceed.  The patient was at home I spoke with the patient from my  office The names of people involved in this encounter were: Dr. Jerene Pitch and Luella Cook.   Postoperative Follow-up Patient presents post op from laparoscopic tubal ligation for requested sterilization, 1 week ago.  Subjective: Patient reports some improvement in her preop symptoms. Eating a regular diet without difficulty. Pain is controlled without any medications.  Activity: normal activities of daily living.   Patient reports additional symptom's since surgery of purulent drainage from right incision site.  She reports that the right sided incision has been more sore and this morning she had drainage of yellow fluid from the incision. She has been unable to rest much since the surgery because she has two COVID positive family members including her newborn.  Not taking pain medication.  Normal BM. Urination normal.   Objective: Ht 5\' 6"  (1.676 m)   Wt 195 lb (88.5 kg)   BMI 31.47 kg/m  OBGyn Exam   Physical Exam could not be performed. Because of the COVID-19 outbreak this visit was performed over the phone and not in person.   She sent photos of her right and left incision sites which I reviewed.  Assessment: s/p :  laparoscopy stable  Plan: Will do 10 days of oral antibiotic for wound infection of right laparoscopy port site.  Patient has otherwise done well after  surgery with no apparent complications.  I have discussed the post-operative course to date, and the expected progress moving forward.  The patient understands what complications to be concerned about.  I will see the patient in routine follow up, or sooner if needed.    Activity plan: No heavy lifting. Pelvic rest.  Okay to return to work on 10/08/2019, patient works from home.  I discussed the assessment and treatment plan with the patient. The patient was provided an opportunity to ask questions and all were answered. The patient agreed with the plan and demonstrated an understanding of the instructions.   The patient was advised to call back or seek an in-person evaluation if the symptoms worsen or if the condition fails to improve as anticipated.  I provided 10 minutes of non-face-to-face time during this encounter.  10/10/2019 MD [facility], [facility] 10/04/2020 8:58 AM

## 2020-10-25 ENCOUNTER — Other Ambulatory Visit: Payer: Self-pay

## 2020-10-25 ENCOUNTER — Ambulatory Visit (INDEPENDENT_AMBULATORY_CARE_PROVIDER_SITE_OTHER): Payer: BC Managed Care – PPO | Admitting: Obstetrics and Gynecology

## 2020-10-25 ENCOUNTER — Encounter: Payer: Self-pay | Admitting: Obstetrics and Gynecology

## 2020-10-25 VITALS — BP 110/72 | Ht 66.0 in | Wt 195.2 lb

## 2020-10-25 DIAGNOSIS — Z9889 Other specified postprocedural states: Secondary | ICD-10-CM

## 2020-10-25 NOTE — Patient Instructions (Signed)
Laparoscopic Tubal Ligation, Care After The following information offers guidance on how to care for yourself after your procedure. Your health care provider may also give you more specific instructions. If you have problems or questions, contact your health care provider. What can I expect after the procedure? After the procedure, it is common to have:  A sore throat if general anesthesia was used.  Pain in shoulders, back, and abdomen. This is caused by the gas that was used during the procedure.  Mild discomfort or cramping in your abdomen.  Pain or soreness in the area where the surgical incision was made.  A bloated feeling.  Tiredness.  Nausea and vomiting. Follow these instructions at home: Medicines  Take over-the-counter and prescription medicines only as told by your health care provider.  Ask your health care provider if the medicine prescribed to you: ? Requires you to avoid driving or using heavy machinery. ? Can cause constipation. You may need to take these actions to prevent or treat constipation:  Drink enough fluid to keep your urine pale yellow.  Take over-the-counter or prescription medicines.  Eat foods that are high in fiber, such as beans, whole grains, and fresh fruits and vegetables.  Limit foods that are high in fat and processed sugars, such as fried or sweet foods.  Do not take aspirin because it can cause bleeding. Incision care  Follow instructions from your health care provider about how to take care of your incision. Make sure you: ? Wash your hands with soap and water for at least 20 seconds before and after you change your bandage (dressing). If soap and water are not available, use hand sanitizer. ? Change your dressing as told by your health care provider. ? Leave stitches (sutures), skin glue, or adhesive strips in place. These skin closures may need to stay in place for 2 weeks or longer. If adhesive strip edges start to loosen and curl  up, you may trim the loose edges. Do not remove adhesive strips completely unless your health care provider tells you to do that.  Check your incision area every day for signs of infection. Check for: ? Redness, swelling, or more pain. ? Fluid or blood. ? Warmth. ? Pus or a bad smell.      Activity  Rest as told by your health care provider.  Avoid sitting for a long time without moving. Get up to take short walks every 1-2 hours. This is important to improve blood flow and breathing. Ask for help if you feel weak or unsteady.  Do not have sex, douche, or put a tampon or anything else in your vagina for 6 weeks or as long as told by your health care provider.  Do not lift anything that is heavier than your baby for 2 weeks, or the limit that you are told, until your health care provider says that it is safe.  Do not take baths, swim, or use a hot tub until your health care provider approves. Ask your health care provider if you may take showers. You may only be allowed to take sponge baths.  Return to your normal activities as told by your health care provider. Ask your health care provider what activities are safe for you. General instructions  After the procedure you may need to wear a sanitary pad for vaginal discharge.  Have someone help you with your daily household tasks for the first few days.  Keep all follow-up visits. This is important. Contact a health   care provider if:  You have redness, swelling, or more pain around your incision.  Your incision feels warm to the touch.  You have pus or a bad smell coming from your incision.  The edges of your incision break open after the sutures have been removed.  Your pain does not improve after 2-3 days.  You have a rash.  You repeatedly become dizzy or light-headed.  Your pain medicine is not helping. Get help right away if:  You have a fever or chills.  You faint.  You have increasing pain in your  abdomen.  You have severe pain in one or both of your shoulders.  You have fluid or blood coming from your sutures or heavy bleeding from your vagina.  You have shortness of breath or difficulty breathing.  You have chest pain, leg pain, or leg swelling.  You have ongoing nausea, vomiting, or diarrhea. These symptoms may represent a serious problem that is an emergency. Do not wait to see if the symptoms will go away. Get medical help right away. Call your local emergency services (911 in the [place] Do not drive yourself to the hospital. Summary  After the procedure, it is common to have mild discomfort or cramping in your abdomen.  After the procedure you may need to wear a sanitary pad for vaginal discharge.  Take over-the-counter and prescription medicines only as told by your health care provider.  Watch for symptoms that should prompt you to call your health care provider.  Keep all follow-up visits. This is important. This information is not intended to replace advice given to you by your health care provider. Make sure you discuss any questions you have with your health care provider. Document Revised: 05/28/2020 Document Reviewed: 05/28/2020 Elsevier Patient Education  2021 Elsevier Inc.  

## 2020-10-25 NOTE — Progress Notes (Signed)
  Postoperative Follow-up Patient presents post op from laparoscopic tubal ligation for requested sterilization, 1 month ago.  Subjective: Patient reports marked improvement in her preop symptoms. Eating a regular diet without difficulty. The patient is not having any pain.  Activity: normal activities of daily living. Patient reports additional symptom's since surgery of None. Reports incisions have healed well with antibiotics.  Objective: BP 110/72   Ht 5\' 6"  (1.676 m)   Wt 195 lb 3.2 oz (88.5 kg)   Breastfeeding Yes   BMI 31.51 kg/m  Physical Exam Constitutional:      Appearance: Normal appearance.  HENT:     Head: Normocephalic.     Nose: Nose normal.     Mouth/Throat:     Mouth: Mucous membranes are moist.  Eyes:     Extraocular Movements: Extraocular movements intact.     Pupils: Pupils are equal, round, and reactive to light.  Cardiovascular:     Rate and Rhythm: Normal rate and regular rhythm.  Pulmonary:     Effort: Pulmonary effort is normal.  Abdominal:     General: Abdomen is flat.     Palpations: Abdomen is soft.     Comments: Incisions: clean dry intact  Musculoskeletal:        General: Normal range of motion.  Neurological:     General: No focal deficit present.     Mental Status: She is alert and oriented to person, place, and time.  Skin:    General: Skin is warm.  Psychiatric:        Mood and Affect: Mood normal.        Behavior: Behavior normal.        Thought Content: Thought content normal.     Assessment: s/p :  tubal ligation stable  Plan: Patient has done well after surgery with no apparent complications.  I have discussed the post-operative course to date, and the expected progress moving forward.  The patient understands what complications to be concerned about.  I will see the patient in routine follow up, or sooner if needed.    Activity plan: No restriction.  MD, [facility], [facility] 10/25/2020 9:03 AM

## 2020-10-25 NOTE — Progress Notes (Signed)
Post op visit

## 2020-12-17 ENCOUNTER — Ambulatory Visit (INDEPENDENT_AMBULATORY_CARE_PROVIDER_SITE_OTHER): Payer: BC Managed Care – PPO | Admitting: Obstetrics

## 2020-12-17 ENCOUNTER — Encounter: Payer: Self-pay | Admitting: Obstetrics

## 2020-12-17 ENCOUNTER — Other Ambulatory Visit: Payer: Self-pay

## 2020-12-17 VITALS — BP 110/80 | Ht 66.0 in | Wt 193.0 lb

## 2020-12-17 DIAGNOSIS — N939 Abnormal uterine and vaginal bleeding, unspecified: Secondary | ICD-10-CM

## 2020-12-17 LAB — POCT URINALYSIS DIPSTICK
Bilirubin, UA: NEGATIVE
Blood, UA: NEGATIVE
Glucose, UA: NEGATIVE
Ketones, UA: NEGATIVE
Leukocytes, UA: NEGATIVE
Nitrite, UA: NEGATIVE
Protein, UA: POSITIVE — AB

## 2020-12-17 LAB — POCT URINE PREGNANCY: Preg Test, Ur: NEGATIVE

## 2020-12-17 NOTE — Telephone Encounter (Signed)
Patient is scheduled for 2:30 with [facility] patient request to see any provider

## 2020-12-17 NOTE — Progress Notes (Signed)
Obstetrics & Gynecology Office Visit   Chief Complaint:  Chief Complaint  Patient presents with  . Vaginal Bleeding    Spotting every day, dark blood, light cramping    History of Present Illness: Marissa Herman presents with concerns regarding some ongoing irregular vaginal bleeding noticed since she had her BTL postpartally. She had a BTL on 09/23/2020. She is back at work x several months, and takes her two young children to daycare. 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     Review of Systems:  Review of Systems  Constitutional: Negative.   HENT: Negative.   Eyes: Negative.   Respiratory: Negative.   Cardiovascular: Negative.   Gastrointestinal: Negative.   Genitourinary: Positive for urgency.  Musculoskeletal: Negative.   Skin: Negative.   Neurological: Negative.   Endo/Heme/Allergies: Negative.      Past Medical History:  Past Medical History:  Diagnosis Date  . History of abnormal cervical Pap smear 09/10/2016   LGSIL/ biopsy +HPV changes  . Medical history non-contributory   . Supervision of other normal pregnancy, antepartum 12/17/2019   [facility] Prenatal Labs Dating  LMP = 8wk [place] Blood type: O/Positive/-- (03/24 1521)  Genetic Screen 1 Screen:  Screen negative, MSAFP negative Antibody:Negative (03/24 1521) Anatomic [place] [facility] Rubella: 4.95 (03/24 1521) Varicella: Immune GTT 28 wk: 60 RPR: Non Reactive (03/24 1521)  Rhogam  not needed HBsAg: Negative (03/24 1521)  Vaccines TDAP:    9/10                   Flu Shot: 9/23 HIV: N    Past Surgical History:  Past Surgical History:  Procedure Laterality Date  . COLPOSCOPY  11/10/2016   [facility] LSIL  . DILATION AND CURETTAGE OF UTERUS N/A 08/20/2020   Procedure: DILATATION AND CURETTAGE;  Surgeon: Natale Milch, MD;  Location: [facility] ORS;  Service:  Gynecology;  Laterality: N/A;  . LAPAROSCOPIC TUBAL LIGATION Bilateral 09/23/2020   Procedure: LAPAROSCOPIC TUBAL LIGATION;  Surgeon: Natale Milch, MD;  Location: [facility] ORS;  Service: Gynecology;  Laterality: Bilateral;  . PERINEAL LACERATION REPAIR N/A 08/05/2020   Procedure: SUTURE REPAIR PERINEAL LACERATION;  Surgeon: Natale Milch, MD;  Location: [facility] ORS;  Service: Gynecology;  Laterality: N/A;  . WISDOM TOOTH EXTRACTION      Gynecologic History: No LMP recorded.  Obstetric History: [id number]  Family History:  Family History  Problem Relation Age of Onset  . Diabetes Father   . Heart disease Maternal Grandmother   . Hypertension Maternal Grandmother   . Stroke Maternal Grandmother   . Heart disease Paternal Grandfather        Had heart attack    Social History:  Social History   Socioeconomic History  . Marital status: Married    Spouse name: Not on file  . Number of children: Not on file  . Years of education: Not on file  . Highest education level: Not on file  Occupational History  . Not on file  Tobacco Use  . Smoking status: Former Smoker    Years: 10.00    Types: Cigarettes    Quit date: 09/15/2017    Years since quitting: 3.2  . Smokeless tobacco: Never Used  . Tobacco comment: 1 pack/week of cigarettes. Quit March 2018  Vaping Use  . Vaping Use: Never used  Substance and Sexual Activity  . Alcohol use: No    Comment: former  . Drug use: No  . Sexual activity: Yes    Partners: Male    Birth control/protection: Surgical    Comment: Tubal LIgation  Other Topics Concern  . Not on file  Social History Narrative  . Not on file   Social Determinants of Health   Financial Resource Strain: Not on file  Food Insecurity: Not on file  Transportation Needs: Not on file  Physical Activity: Not on file  Stress: Not on file  Social Connections: Not on file  Intimate Partner Violence: Not on file    Allergies:  Allergies  Allergen  Reactions  . Other Rash    Nitrile gloves prolong exposure Endotracheal tubes cause mouth blisters    Medications: Prior to Admission medications   Medication Sig Start Date End Date Taking? Authorizing Provider  Prenatal MV-Min-FA-Omega-3 (PRENATAL GUMMIES/DHA & FA PO) Take 1 tablet by mouth daily at 12 noon.   Yes [provider]  omeprazole (PRILOSEC) 20 MG capsule Take 1 capsule (20 mg total) by mouth daily. Patient not taking: Reported on 12/17/2020 07/09/20   Tresea Mall, CNM    Physical Exam Vitals:  Vitals:   12/17/20 1424  BP: 110/80   No LMP recorded.  Physical Exam Constitutional:      Appearance: Normal appearance.  HENT:     Head: Normocephalic and atraumatic.     Nose: Nose normal.  Cardiovascular:     Rate and Rhythm: Normal rate and regular rhythm.  Pulmonary:     Effort: Pulmonary effort is normal.     Breath sounds: Normal breath sounds.  Abdominal:     Palpations: Abdomen is soft.  Genitourinary:    General: Normal vulva.     Comments: Shaves mons. No rashes or lesions externally. Speculum exam: normal vaginal rugae, no visible discharge or blood noted in the vault. Cervix is multiparous in appearance. Bimanual: uterus is non enlarged and anteverted. No CMT noted. Musculoskeletal:        General: Normal range of motion.     Cervical back: Normal range of motion and neck supple.  Skin:    General: Skin is warm and dry.  Neurological:     General: No focal deficit present.     Mental Status: She is alert and oriented to person, place, and time.  Psychiatric:        Mood and Affect: Mood normal.        Behavior: Behavior normal.      Assessment: 30 y.o. G2P2002  Five months postpartum with irregular spotting related to breastfeeding and suppressed ovulation    Plan: Problem List Items Addressed This Visit   None   Visit Diagnoses    Vaginal spotting    -  Primary     UPT is negative today. Urine dip shows some protein, no  bacteria or leuks or nitrites. Reassurance provided that her irregular light spotting is likely related to her ongoing lactation. She will continue to monitor and RTC should she waen and then continue with the irregular bleeding. She verbalizes understanding.  A total of 25 minutes is spent providing direct patient care, review of records and documentation.  Mirna Mires, CNM  12/17/2020 3:24 PM

## 2020-12-17 NOTE — Addendum Note (Signed)
Addended by: Donnetta Hail on: 12/17/2020 03:51 PM   Modules accepted: Orders

## 2020-12-19 IMAGING — US US PELVIS COMPLETE
1 series · 14 of 25 positions shown · non-contrast
Comparison: None.

CLINICAL DATA: Vaginal delivery 08/05/2020, postpartum bleeding

EXAM:
TRANSABDOMINAL ULTRASOUND OF PELVIS
TECHNIQUE: Transabdominal ultrasound examination of the pelvis was performed
including evaluation of the uterus, ovaries, adnexal regions, and
pelvic cul-de-sac.

[Series 1: us pelvis complete · 0.21mm/px · 54 acquisitions, 14 frames shown]
[im 1/54]
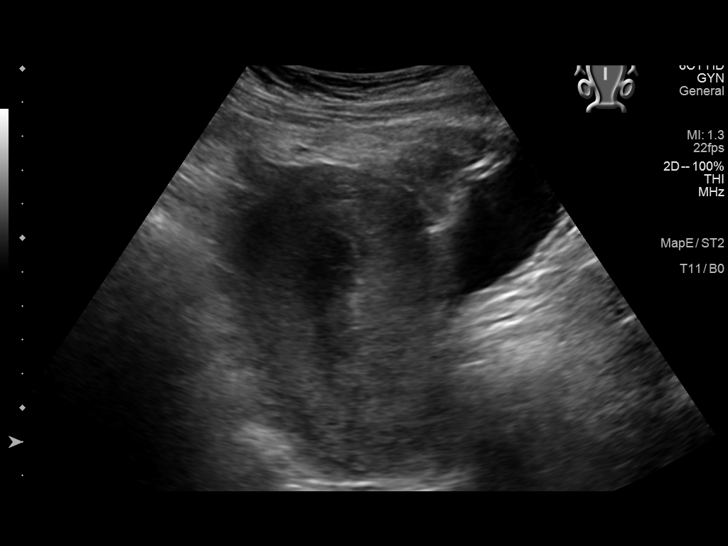
[im 5/54]
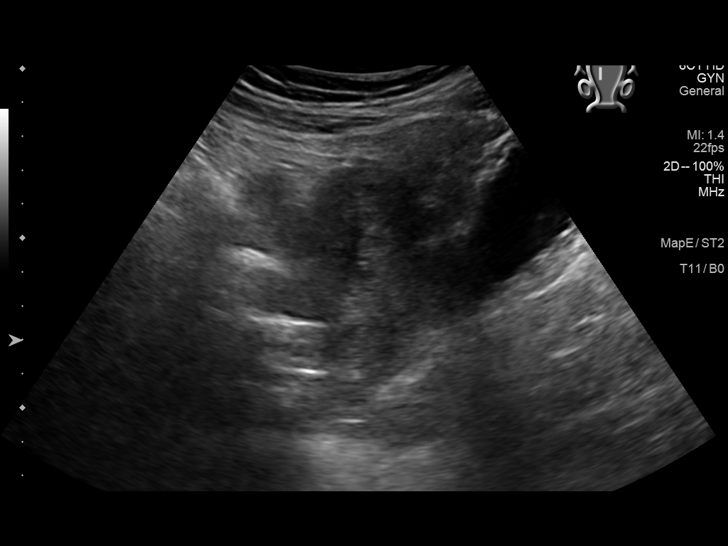
[im 9/54]
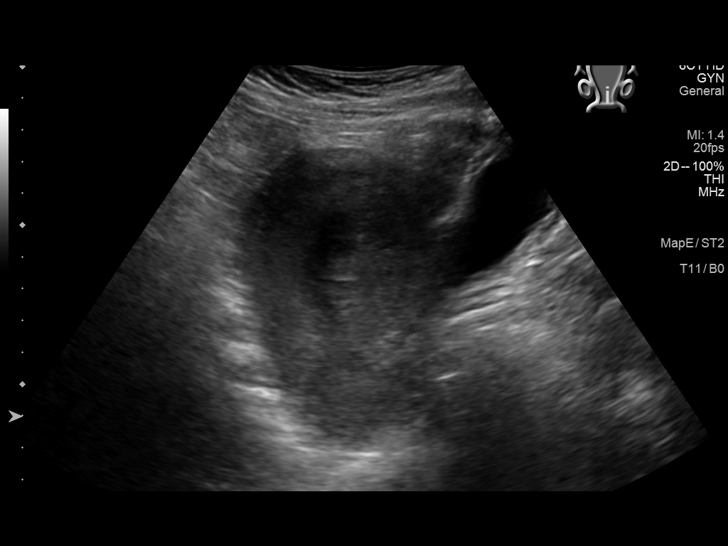
[im 14/54]
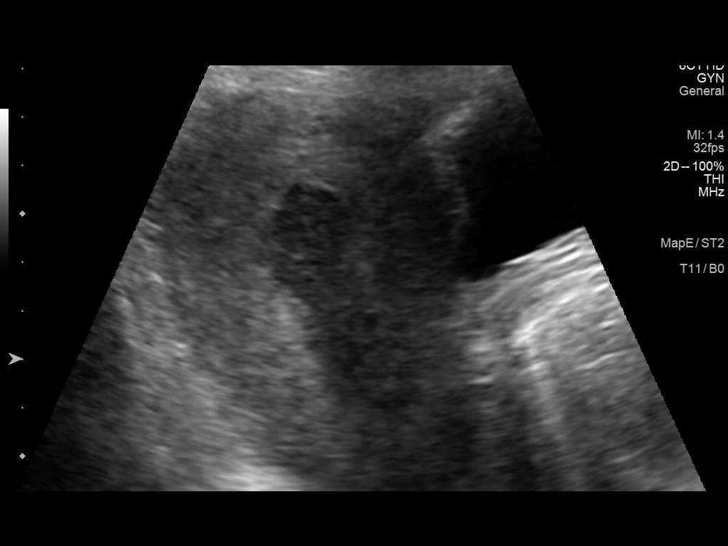
[im 18/54]
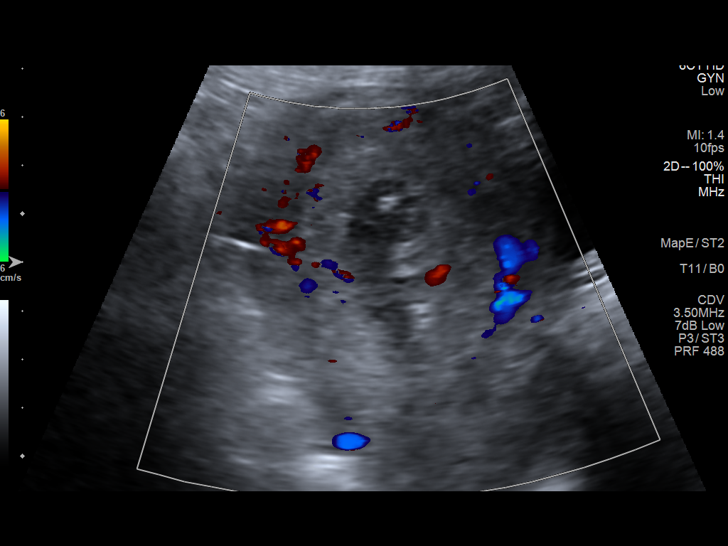
[im 20/54]
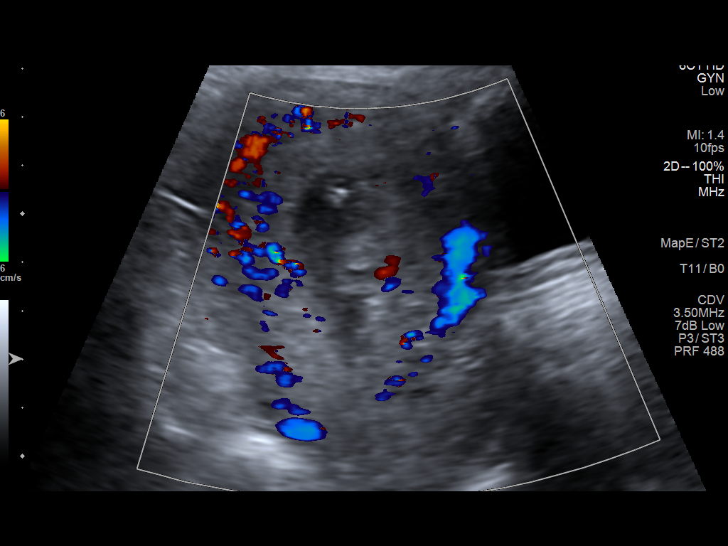
[im 25/54]
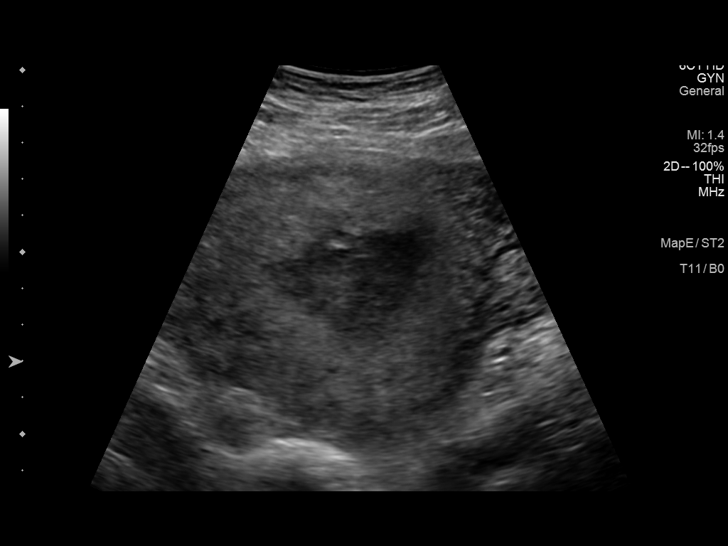
[im 29/54]
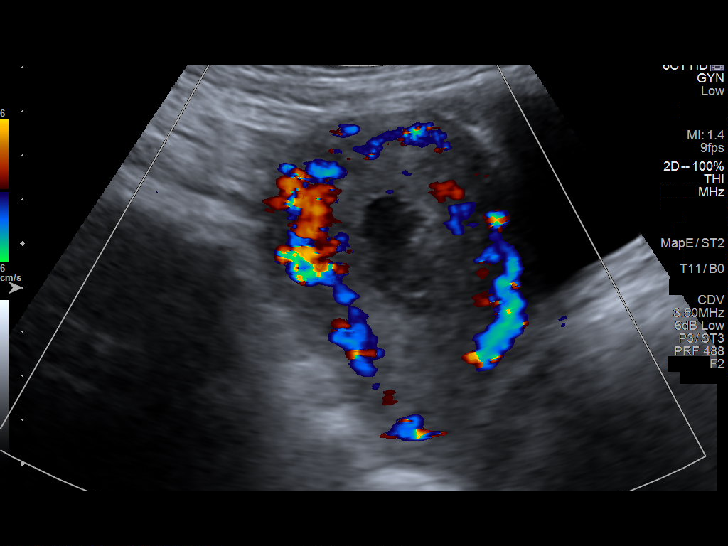
[im 34/54]
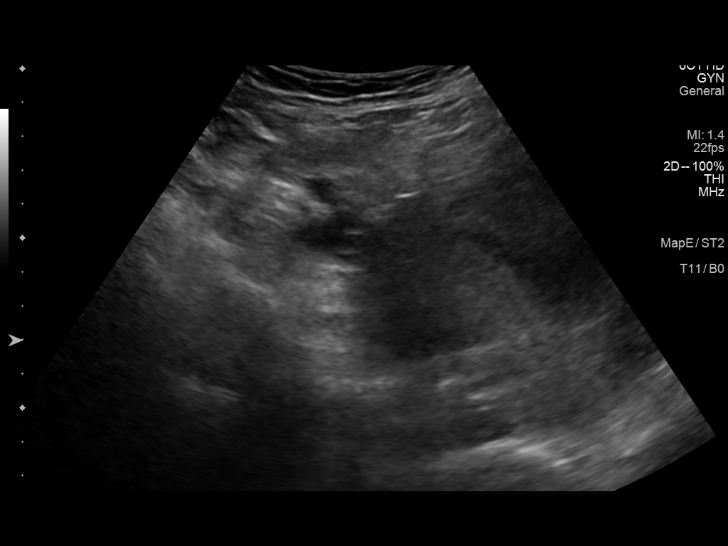
[im 36/54]
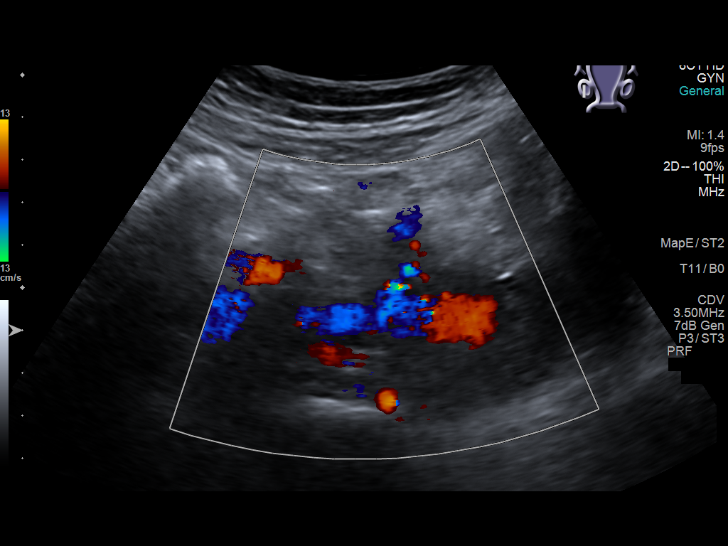
[im 40/54]
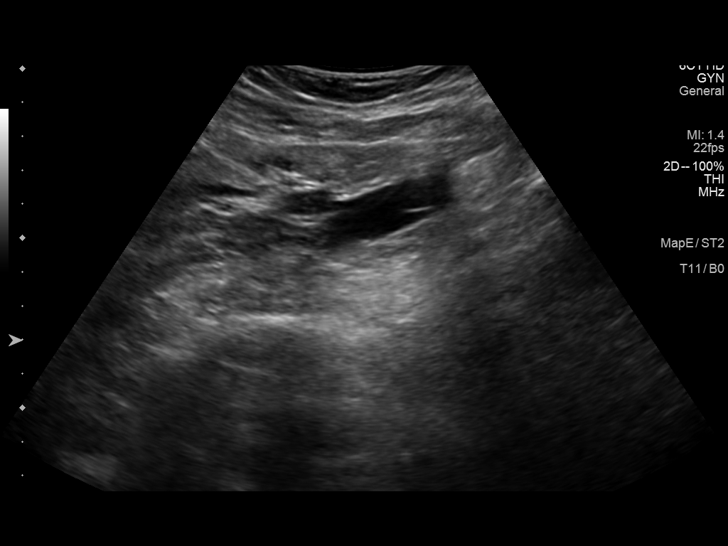
[im 45/54]
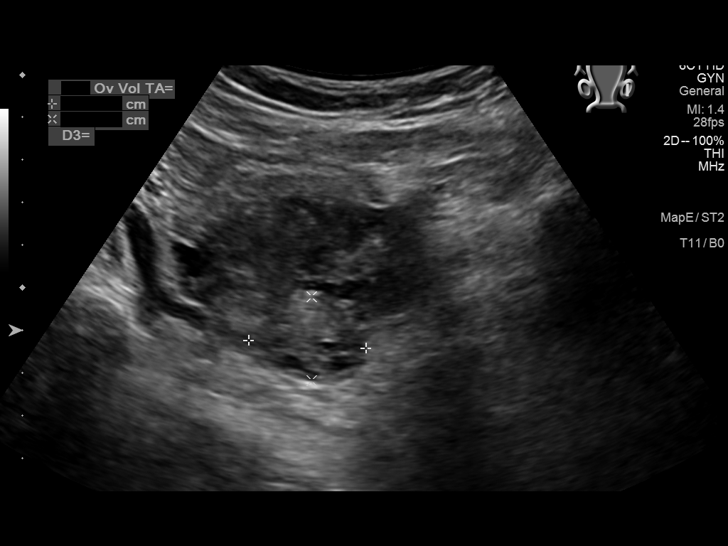
[im 49/54]
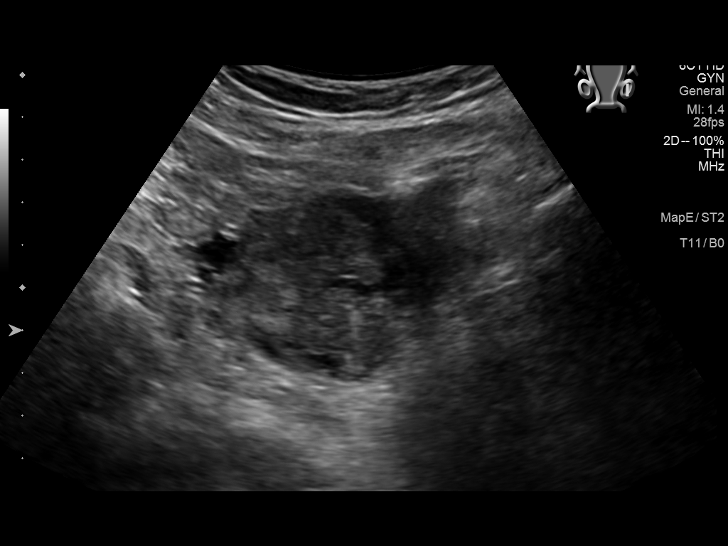
[im 54/54]
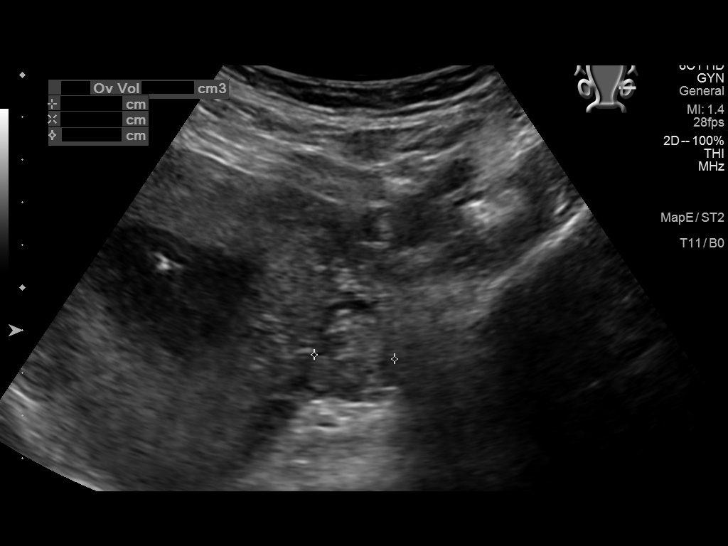

[14 of 25 positions shown; findings below may reference images not displayed]

FINDINGS: Uterus

Measurements: 9.7 x 9.5 x 5.7 cm = volume: 273 mL. No fibroids or
other mass visualized.

Endometrium

Heterogeneous debris within the endometrial cavity. Overall
thickness (including debris) measures 24 mm. Mild peripheral
vascularity.

Right ovary

Not discretely visualized.  No adnexal mass is seen.

Left ovary

Measurements: 2.8 x 2.0 x 1.9 cm = volume: 5.4 mL. Normal
appearance/no adnexal mass.

Other findings:  No abnormal free fluid.
IMPRESSION: Heterogeneous debris within the endometrial cavity, measuring up to
24 mm. Mild peripheral vascularity. This appearance is nonspecific
but raises concern for retained products of conception in this
clinical context.

## 2021-11-23 ENCOUNTER — Other Ambulatory Visit: Payer: Self-pay
